# Patient Record
Sex: Female | Born: 1950 | Race: Black or African American | Hispanic: No | Marital: Married | Smoking: Former smoker
Health system: Southern US, Community
[De-identification: ages and names within clinical notes are randomized; demographics above are authoritative.]

## PROBLEM LIST (undated history)

## (undated) DIAGNOSIS — J449 Chronic obstructive pulmonary disease, unspecified: Secondary | ICD-10-CM

## (undated) DIAGNOSIS — J45909 Unspecified asthma, uncomplicated: Secondary | ICD-10-CM

## (undated) DIAGNOSIS — I10 Essential (primary) hypertension: Secondary | ICD-10-CM

## (undated) HISTORY — DX: Essential (primary) hypertension: I10

## (undated) HISTORY — DX: Chronic obstructive pulmonary disease, unspecified: J44.9

## (undated) HISTORY — DX: Unspecified asthma, uncomplicated: J45.909

---

## 2009-02-20 ENCOUNTER — Ambulatory Visit: Payer: Self-pay | Admitting: Internal Medicine

## 2009-02-20 DIAGNOSIS — I10 Essential (primary) hypertension: Secondary | ICD-10-CM

## 2009-02-20 DIAGNOSIS — J449 Chronic obstructive pulmonary disease, unspecified: Secondary | ICD-10-CM | POA: Insufficient documentation

## 2009-02-20 DIAGNOSIS — R635 Abnormal weight gain: Secondary | ICD-10-CM | POA: Insufficient documentation

## 2009-03-27 ENCOUNTER — Ambulatory Visit: Payer: Self-pay | Admitting: Internal Medicine

## 2009-03-27 ENCOUNTER — Telehealth: Payer: Self-pay | Admitting: Internal Medicine

## 2009-04-04 ENCOUNTER — Telehealth: Payer: Self-pay | Admitting: Internal Medicine

## 2009-05-01 ENCOUNTER — Encounter: Payer: Self-pay | Admitting: Internal Medicine

## 2009-06-24 ENCOUNTER — Ambulatory Visit: Payer: Self-pay | Admitting: Internal Medicine

## 2010-01-10 ENCOUNTER — Telehealth: Payer: Self-pay | Admitting: Internal Medicine

## 2010-01-27 ENCOUNTER — Telehealth (INDEPENDENT_AMBULATORY_CARE_PROVIDER_SITE_OTHER): Payer: Self-pay | Admitting: *Deleted

## 2010-03-11 NOTE — Assessment & Plan Note (Signed)
Summary: Pulmonary/ final summary f/u ov   Copy to:  Dr. [name] Primary Provider/Referring Provider:  none  CC:  3 month followup.  Pt states that her breathing has been doing well.  She c/o nasal congestion x 1 wk- she relates this to pollen allergy.  No other complaints today.  Marland Kitchen  History of Present Illness: 51  yowb quit smoking 2004 @ wt 130   with breathing problems  and cough then seemed some better quit but still required combivent about twice daily.  February 20, 2009 cc sob x 1.5 years to point where sometimes can't get across a large room other times can do mall waling and house work with no discernable pattern.  No nocturnal or early am cough or congestion but occasionally develops harsh upper airway cough with deep insp rec symbicort 160  March 27, 2009 1 month followup.  Pt states that her breathing is much improved since last seen.  She stateds that she only gets SOB after very strenuous activity and she does recover fast.  She states that symbicort has really helped alot with minimal need for albuterol now.  Jun 24, 2009 3 month followup.  Pt states that her breathing has been doing well.  She c/o nasal congestion x 1 wk- she relates this to pollen allergy.  No other complaints today.  did 3 miles in April for MS had to stops 3 times per mile.  Pt denies any significant sore throat, dysphagia, itching, sneezing,  nasal congestion or excess secretions,  fever, chills, sweats, unintended wt loss, pleuritic or exertional cp, hempoptysis, change in activity tolerance  orthopnea pnd or leg swelling.  Pt also denies any obvious fluctuation in symptoms with weather or environmental change or other alleviating or aggravating factors.       Current Medications (verified): 1)  Hyzaar 50-12.5 Mg Tabs (Losartan Potassium-Hctz) .Marland Kitchen.. 1 Once Daily 2)  Aspirin 81 Mg Tbec (Aspirin) .Marland Kitchen.. 1 Once Daily 3)  Multivitamins  Tabs (Multiple Vitamin) .Marland Kitchen.. 1 Once Daily 4)  Ventolin Hfa 108 (90  Base) Mcg/act Aers (Albuterol Sulfate) .... 2 Puffs Every 4-6 Hours As Needed 5)  Symbicort 160-4.5 Mcg/act  Aero (Budesonide-Formoterol Fumarate) .... 2 Puffs First Thing  in Am and 2 Puffs Again in Pm About 12 Hours Later  Allergies (verified): No Known Drug Allergies  Past History:  Past Medical History: HYPERTENSION (ICD-401.9) COPD (ICD-496)    - PFT's February 20, 2009  FEV .62 (32%) ratio 38    - HFA  February 20, 2009 50%> 75% March 27, 2009 > 90% Jun 24, 2009     - PFT's March 27, 2009 FEV1 .85 (40%)  44% and no better adfter B2 and DLC0 39% ? childhood asthma     Vital Signs:  Patient profile:   60 year old female Weight:      161 pounds O2 Sat:      94 % on Room air Temp:     98.5 degrees F oral Pulse rate:   106 / minute BP sitting:   160 / 86  (left arm)  Vitals Entered By: Vernie Murders (Jun 24, 2009 11:53 AM)  O2 Flow:  Room air  Physical Exam  Additional Exam:  very pleasant amb bf nad wt 160  March 27, 2009 > 161 Jun 24, 2009  HEENT mild turbinate edema.  Oropharynx no thrush or excess pnd or cobblestoning.  No JVD or cervical adenopathy. Mild accessory muscle hypertrophy. Trachea midline, nl thryroid. Chest  was hyperinflated by percussion with diminished breath sounds and moderate increased exp time without wheeze. Hoover sign positive at mid inspiration. Regular rate and rhythm without murmur gallop or rub or increase P2 or edema.  Abd: no hsm, nl excursion. Ext warm without cyanosis or clubbing.     Impression & Recommendations:  Problem # 1:  COPD (ICD-496) She has GOLD III mod / severe copd with clear evidence clinically of response to B2 and responded nicely to symbicort despite suboptimal hfa technique in the past.  I had an extended summary discussion with the patient today lasting 15 to 20 minutes of a 25 minute visit on the following issues:  . I spent extra time with the patient today explaining optimal mdi  technique.  This improved from   75 -90%  Ok  off spiriva> no  affects ex tolerance, though option to add it back it back if limited by doe in terms of desired activities.  Comment:   I think of spiriva in this setting like purchasing high octane fuel for an older car with lots of miles on it. It may help the perfomance enough to warrant the purchase, but it won't change the longevity of the car or make it any easier parking it. It should improve peak performance if the patient is patient and lets the medicine work the way it's intended  - improving activity tolerance where limits on the mechanical ventilatory ceiling causing dynamic hyperinflation is the problem.   Problem # 2:  HYPERTENSION (ICD-401.9)  Her updated medication list for this problem includes:    Hyzaar 50-12.5 Mg Tabs (Losartan potassium-hctz) .Marland Kitchen... 1 once daily   Adjust rx per her primary physician.  Other Orders: Est. Patient Level IV (16109)  Patient Instructions: 1)  Stay on Symbicort 160 2 puffs first thing  in am and 2 puffs again in pm about 12 hours later and return here if needed

## 2010-03-11 NOTE — Consult Note (Signed)
Summary: [facility]   Imported By: Lester [place] 05/08/2009 10:32:03  _____________________________________________________________________  External Attachment:    Type:   Image     Comment:   External Document

## 2010-03-11 NOTE — Progress Notes (Signed)
Summary: No need for dailiresp here   Phone Note Call from Patient Call back at [phone]   Caller: Daughter-amelia Hoard Call For: Rajat Staver Reason for Call: Talk to Nurse Summary of Call: Requesting to speak to Shepherd about mother.  Has a question about a new drug that drug reps have been talking to her about and wants to know if Dr. Sherene Sires knows anything about med Initial call taken by: Lehman Prom,  January 10, 2010 9:52 AM  Follow-up for Phone Call        [id number]. Carron Curie CMA  January 10, 2010 11:13 AM Spoke with pt daughter Dr. Okey Dupre and she states that drug reps that come into her office keep pushing her to put her mother on dailresp. She is not familiar with the medication and wants to know what MW opiion is. Does her mom need this medication, is she a candidate for it and does MW think it is appropriate for her. If not that is fine with them she just wanted to [facility] his opinion. Please advsie.Carron Curie CMA  January 10, 2010 12:11 PM very limited indication for pts with frequent acute exac despite a reasonable maintenance regimen - in this case pt's doing fine on symbicort so no need for dailiresp Follow-up by: Nyoka Cowden MD,  January 10, 2010 12:45 PM  Additional Follow-up for Phone Call Additional follow up Details #1::        advised pt daughter of response. she also states that pt had PNA vaccine at PCP. Vaccine documented Carron Curie CMA  January 10, 2010 12:50 PM       Immunization History:  Pneumovax Immunization History:    Pneumovax:  historical (01/10/2010)

## 2010-03-11 NOTE — Assessment & Plan Note (Signed)
Summary: Pulmonary/f/u ov with pft's and hfa teaching   Copy to:  Dr. [name] Primary Provider/Referring Provider:  none  CC:  1 month followup.  Pt states that her breathing is much improved since last seen.  She stateds that she only gets SOB after very strenuous activity and she does recover fast.  She states that symbicort has really helped alot and denies any complaints..  History of Present Illness: 24  yowb quit smoking 2004 @ wt 130   with breathing problems  and cough then seemed some better quit but still required combivent about twice daily.  February 20, 2009 cc sob x 1.5 years to point where sometimes can't get across a large room other times can do mall waling and house work with no discernable pattern.  No nocturnal or early am cough or congestion but occasionally develops harsh upper airway cough with deep insp rec symbicort 160  March 27, 2009 1 month followup.  Pt states that her breathing is much improved since last seen.  She stateds that she only gets SOB after very strenuous activity and she does recover fast.  She states that symbicort has really helped alot with minimal need for albuterol now. Pt denies any significant sore throat, dysphagia, itching, sneezing,  nasal congestion or excess secretions,  fever, chills, sweats, unintended wt loss, pleuritic or exertional cp, hempoptysis, change in activity tolerance  orthopnea pnd or leg swelling. Pt also denies any obvious fluctuation in symptoms with weather or environmental change or other alleviating or aggravating factors.          Current Medications (verified): 1)  Spiriva Handihaler 18 Mcg Caps (Tiotropium Bromide Monohydrate) .... Inhale Contents of 1 Cap Daily 2)  Hyzaar 50-12.5 Mg Tabs (Losartan Potassium-Hctz) .Marland Kitchen.. 1 Once Daily 3)  Aspirin 81 Mg Tbec (Aspirin) .Marland Kitchen.. 1 Once Daily 4)  Multivitamins  Tabs (Multiple Vitamin) .Marland Kitchen.. 1 Once Daily 5)  Ventolin Hfa 108 (90 Base) Mcg/act Aers (Albuterol Sulfate)  .... 2 Puffs Every 4-6 Hours As Needed 6)  Symbicort 160-4.5 Mcg/act  Aero (Budesonide-Formoterol Fumarate) .... 2 Puffs First Thing  in Am and 2 Puffs Again in Pm About 12 Hours Later  Allergies (verified): No Known Drug Allergies  Past History:  Past Medical History: HYPERTENSION (ICD-401.9) COPD (ICD-496)    - PFT's February 20, 2009  FEV .62 (32%) ratio 38    - HFA  February 20, 2009 50%> 75% March 27, 2009     - PFT's March 27, 2009 FEV1 .85 (40%)  44% and no better adfter B2 and DLC0 39% ? childhood asthma     Vital Signs:  Patient profile:   60 year old female Weight:      160 pounds O2 Sat:      94 % on Room air Temp:     98.4 degrees F oral Pulse rate:   98 / minute BP sitting:   130 / 70  (left arm)  Vitals Entered By: Vernie Murders (March 27, 2009 10:59 AM)  O2 Flow:  Room air  Physical Exam  Additional Exam:  very pleasant amb bf nad wt 159 > 160  March 27, 2009  HEENT mild turbinate edema.  Oropharynx no thrush or excess pnd or cobblestoning.  No JVD or cervical adenopathy. Mild accessory muscle hypertrophy. Trachea midline, nl thryroid. Chest was hyperinflated by percussion with diminished breath sounds and moderate increased exp time without wheeze. Hoover sign positive at mid inspiration. Regular rate and rhythm without murmur gallop  or rub or increase P2 or edema.  Abd: no hsm, nl excursion. Ext warm without cyanosis or clubbing.     Impression & Recommendations:  Problem # 1:  COPD (ICD-496) I had an extended discussion with the patient today lasting 15 to 20 minutes of a 25 minute visit on the following issues:  She has GOLD III mod / severe copd with clear evidence clinically of response to B2 and responded nicely to symbicort despite suboptimal hfa technique. . I spent extra time with the patient today explaining optimal mdi  technique.  This improved from  50-75%  Ok to try off spiriva to see if affects ex tolerance.  Comment:   I think  of spiriva in this setting like purchasing high octane fuel for an older car with lots of miles on it. It may help the perfomance enough to warrant the purchase, but it won't change the longevity of the car or make it any easier parking it. It should improve peak performance if the patient is patient and lets the medicine work the way it's intended  - improving activity tolerance where limits on the mechanical ventilatory ceiling causing dynamic hyperinflation is the problem.   Other Orders: Est. Patient Level IV (96295)  Patient Instructions: 1)  Symbicort 2 puffs first thing  in am and 2 puffs again in pm about 12 hours later 2)  Work on inhaler technique:  relax and blow all the way out then take a nice smooth deep breath back in, triggering the inhaler at same time you start breathing in 3)  GERD (REFLUX)  is a common cause of respiratory symptoms. It commonly presents without heartburn and can be treated with medication, but also with lifestyle changes including avoidance of late meals, excessive alcohol, smoking cessation, and avoid fatty foods, chocolate, peppermint, colas, red wine, and acidic juices such as orange juice. NO MINT OR MENTHOL PRODUCTS SO NO COUGH DROPS  4)  USE SUGARLESS CANDY INSTEAD (jolley ranchers)  5)  NO OIL BASED VITAMINS  6)  Return to office in 3 months, sooner if needed  7)  ok to try off spiriva but if breathing worsens over several weeks need to resum it.

## 2010-03-11 NOTE — Assessment & Plan Note (Signed)
Summary: Pulmonary/ initial eval, start symbicort 160   Visit Type:  Initial Consult Copy to:  Dr. [name] Primary Provider/Referring Provider:  none  CC:  COPD.  History of Present Illness: 60 yowb quit smoking 2004 @ wt 130   with breathing problems  and cough then seemed some better quit but still required combivent about twice daily.  February 20, 2009 cc sob x 1.5 years to point where sometimes can't get across a large room other times can do mall waling and house work with no discernable pattern.  No nocturnal or early am cough or congestion but occasionally develops harsh upper airway cough with deep insp  Pt denies any significant sore throat, dysphagia, itching, sneezing,  nasal congestion or excess secretions,  fever, chills, sweats, unintended wt loss, pleuritic or exertional cp, hempoptysis, change in activity tolerance  orthopnea pnd or leg swelling Pt also denies any obvious fluctuation in symptoms with weather or environmental change or other alleviating or aggravating factors.       Current Medications (verified): 1)  Spiriva Handihaler 18 Mcg Caps (Tiotropium Bromide Monohydrate) .... Inhale Contents of 1 Cap Daily 2)  Hyzaar 50-12.5 Mg Tabs (Losartan Potassium-Hctz) .Marland Kitchen.. 1 Once Daily 3)  Aspirin 81 Mg Tbec (Aspirin) .Marland Kitchen.. 1 Once Daily 4)  Multivitamins  Tabs (Multiple Vitamin) .Marland Kitchen.. 1 Once Daily 5)  Ventolin Hfa 108 (90 Base) Mcg/act Aers (Albuterol Sulfate) .... 2 Puffs Every 4-6 Hours As Needed  Allergies (verified): No Known Drug Allergies  Past History:  Past Medical History: HYPERTENSION (ICD-401.9) COPD (ICD-496)    - PFT's February 20, 2009  FEV .62 (32%) ratio 38    - HFA  February 20, 2009 50% ? childhood asthma     Family History: Heart dz- Father 18s Head and Neck CA- Mother HBP/dm in siblings pt is second to oldest  Social History: Single Children Former smoker.  Quit in 2004.  Quit for approx 30 yrs up to 1 ppd. Occ marijuana use Rare  ETOH Disabled  Review of Systems       The patient complains of shortness of breath with activity, productive cough, weight change, tooth/dental problems, and joint stiffness or pain.  The patient denies shortness of breath at rest, non-productive cough, coughing up blood, chest pain, irregular heartbeats, acid heartburn, indigestion, loss of appetite, abdominal pain, difficulty swallowing, sore throat, headaches, nasal congestion/difficulty breathing through nose, sneezing, itching, ear ache, anxiety, depression, hand/feet swelling, rash, change in color of mucus, and fever.    Vital Signs:  Patient profile:   60 year old female Height:      62 inches Weight:      159.50 pounds BMI:     29.28 O2 Sat:      96 % on Room air Temp:     97.5 degrees F oral Pulse rate:   100 / minute BP sitting:   122 / 74  (left arm)  Vitals Entered By: Vernie Murders (February 20, 2009 2:29 PM)  O2 Flow:  Room air  O2 Sat Comments when pt arrived o2 sat was 86%ra  Physical Exam  Additional Exam:  very pleasant amb bf nad HEENT mild turbinate edema.  Oropharynx no thrush or excess pnd or cobblestoning.  No JVD or cervical adenopathy. Mild accessory muscle hypertrophy. Trachea midline, nl thryroid. Chest was hyperinflated by percussion with diminished breath sounds and moderate increased exp time without wheeze. Hoover sign positive at mid inspiration. Regular rate and rhythm without murmur gallop or rub or  increase P2 or edema.  Abd: no hsm, nl excursion. Ext warm without cyanosis or clubbing.     CXR  Procedure date:  02/20/2009  Findings:        Findings: Heart size is normal.  The mediastinum is unremarkable. The lungs are clear.  A linear scar in the right midlung.  There may be emphysema in the upper lobes.  No infiltrate, mass, effusion or collapse.  No significant bony abnormality.   IMPRESSION: Linear scar right midlung.   Hyperlucency in the upper lobes that could reflect  emphysema.  Pulmonary Function Test Date: 02/20/2009 3:20 PM Gender: Female  Pre-Spirometry FVC    Value: 1.64 L/min   % Pred: 66.30 % FEV1    Value: 0.63 L     Pred: 1.95 L     % Pred: 32.20 % FEV1/FVC  Value: 38.25 %     % Pred: 47.90 %  Impression & Recommendations:  Problem # 1:  COPD (ICD-496) GOLD III severe copd by baseline pft's not responding well to spiriva monotherapy.  Hopefully she has a significant asthmatic component suggested by the variability of her symptoms and increasing need for saba  I spent extra time with the patient today explaining optimal mdi  technique.  This improved from  25-50%  Would like to try symbiocort 160 2 puffs first thing  in am and 2 puffs again in pm about 12 hours later x 4 weeks then return for PFT's  Problem # 2:  HYPERTENSION (ICD-401.9)  May need to consider alternative rx based on anecdotal reports of ace -like side effects of losartan.  for now no change rec  Her updated medication list for this problem includes:    Hyzaar 50-12.5 Mg Tabs (Losartan potassium-hctz) .Marland Kitchen... 1 once daily  Orders: New Patient Level V (16109)  Problem # 3:  WEIGHT GAIN, ABNORMAL (ICD-783.1) She has gain significant wt since quit smoking and might benefit from a formal pulmonary rehab program locally to maintain conditioning and prevent further wt gain.  Medications Added to Medication List This Visit: 1)  Spiriva Handihaler 18 Mcg Caps (Tiotropium bromide monohydrate) .... Inhale contents of 1 cap daily 2)  Hyzaar 50-12.5 Mg Tabs (Losartan potassium-hctz) .Marland Kitchen.. 1 once daily 3)  Aspirin 81 Mg Tbec (Aspirin) .Marland Kitchen.. 1 once daily 4)  Multivitamins Tabs (Multiple vitamin) .Marland Kitchen.. 1 once daily 5)  Ventolin Hfa 108 (90 Base) Mcg/act Aers (Albuterol sulfate) .... 2 puffs every 4-6 hours as needed 6)  Symbicort 160-4.5 Mcg/act Aero (Budesonide-formoterol fumarate) .... 2 puffs first thing  in am and 2 puffs again in pm about 12 hours later  Other Orders: T-2 View  CXR (71020TC)  Patient Instructions: 1)  Symbicort 2 puffs first thing  in am and 2 puffs again in pm about 12 hours later 2)  Work on inhaler technique:  relax and blow all the way out then take a nice smooth deep breath back in, triggering the inhaler at same time you start breathing in 3)  GERD (REFLUX)  is a common cause of respiratory symptoms. It commonly presents without heartburn and can be treated with medication, but also with lifestyle changes including avoidance of late meals, excessive alcohol, smoking cessation, and avoid fatty foods, chocolate, peppermint, colas, red wine, and acidic juices such as orange juice. NO MINT OR MENTHOL PRODUCTS SO NO COUGH DROPS  4)  USE SUGARLESS CANDY INSTEAD (jolley ranchers)  5)  NO OIL BASED VITAMINS  6)  Please schedule a follow-up appointment  in 1 month with PFT's   [facility] Spirometry  ID: [id number] Patient: Ausley, Elianne DOB: 1950/11/24 Age: 60 Years Old Sex: Female Race: Black Height: 62 Weight: 159.50 Status: Unconfirmed Recorded: 02/20/2009 3:20 PM  Parameter  Measured Predicted %Predicted FVC     1.64        2.47        66.30 FEV1     0.63        1.95        32.20 FEV1%   38.25        79.83        47.90 PEF    1.13        5.38        21   Interpretation:

## 2010-03-11 NOTE — Progress Notes (Signed)
Summary: rx  Phone Note Call from Patient Call back at Home Phone [phone]   Caller: Patient Call For: Lauran Romanski Reason for Call: Talk to Nurse Summary of Call: pt needs Ventilin called in to pharmacy  [facility] [address] [place] Initial call taken by: Eugene Gavia,  April 04, 2009 1:51 PM  Follow-up for Phone Call        ventolin has been sent to the pharmacy per pts request.  pt is aware Randell Loop CMA  April 04, 2009 2:22 PM     Prescriptions: VENTOLIN HFA 108 (90 BASE) MCG/ACT AERS (ALBUTEROL SULFATE) 2 puffs every 4-6 hours as needed  #1 x 6   Entered by:   Randell Loop CMA   Authorized by:   Nyoka Cowden MD   Signed by:   Randell Loop CMA on 04/04/2009   Method used:   [facility]        [facility] [phone]* (retail)       [address]       [place], [place]  [address]       Ph: [phone] or [phone]       Fax: [phone]   RxID:   [id number]

## 2010-03-11 NOTE — Miscellaneous (Signed)
Summary: Orders Update pft charges  Clinical Lists Changes  Orders: Added new Service order of Carbon Monoxide diffusing w/capacity (94720) - Signed Added new Service order of Lung Volumes (94240) - Signed Added new Service order of Spirometry (Pre & Post) (94060) - Signed 

## 2010-03-13 NOTE — Progress Notes (Signed)
Summary: rx  Phone Note Call from Patient Call back at Home Phone [phone]   Caller: Patient Call For: wert Reason for Call: Talk to Nurse Summary of Call: Patient asking for rx--symbicort--[facility] Initial call taken by: Lehman Prom,  January 27, 2010 9:07 AM  Follow-up for Phone Call        [facility] refill for Symbicort was sent to pharmacy. Abigail Miyamoto RN  January 27, 2010 10:13 AM     Prescriptions: SYMBICORT 160-4.5 MCG/ACT  AERO (BUDESONIDE-FORMOTEROL FUMARATE) 2 puffs first thing  in am and 2 puffs again in pm about 12 hours later  #1 x 6   Entered by:   Abigail Miyamoto RN   Authorized by:   Nyoka Cowden MD   Signed by:   Abigail Miyamoto RN on 01/27/2010   Method used:   [facility]ronically to        [facility] [phone]* (retail)       [address]       [place], [place]  [address]       Ph: [phone] or [phone]       Fax: [phone]   RxID:   [id number]

## 2010-06-18 ENCOUNTER — Encounter: Payer: Self-pay | Admitting: Internal Medicine

## 2010-06-18 ENCOUNTER — Ambulatory Visit (INDEPENDENT_AMBULATORY_CARE_PROVIDER_SITE_OTHER): Payer: Medicare Other | Admitting: Internal Medicine

## 2010-06-18 DIAGNOSIS — J449 Chronic obstructive pulmonary disease, unspecified: Secondary | ICD-10-CM

## 2010-06-18 DIAGNOSIS — J4489 Other specified chronic obstructive pulmonary disease: Secondary | ICD-10-CM

## 2010-06-18 DIAGNOSIS — I10 Essential (primary) hypertension: Secondary | ICD-10-CM

## 2010-06-18 MED ORDER — VALSARTAN-HYDROCHLOROTHIAZIDE 160-12.5 MG PO TABS: 1.0000 | ORAL_TABLET | Freq: Every day | ORAL | Status: DC

## 2010-06-18 NOTE — Patient Instructions (Signed)
Work on inhaler technique:  relax and gently blow all the way out then take a nice smooth deep breath back in, triggering the inhaler at same time you start breathing in.  Hold for up to 5 seconds if you can.  Rinse and gargle with water when done   If your mouth or throat starts to bother you,   I suggest you time the inhaler to your dental care and after using the inhaler(s) brush teeth and tongue with a baking soda containing toothpaste and when you rinse this out, gargle with it first to see if this helps your mouth and throat.    Stop lisinopril and try diovan 160/12.5 mg in its place for a 6 week trial you may notice less throat irritation, less heartburn, better breathing and less need for rescue inhalters.

## 2010-06-18 NOTE — Progress Notes (Signed)
  Subjective:    Patient ID: [name], female    DOB: April 22, 1950, 60 y.o.   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  February 20, 2009 cc sob x 1.5 years to point where sometimes can't get across a large room other times can do mall waling and house work with no discernable pattern. No nocturnal or early am cough or congestion but occasionally develops harsh upper airway cough with deep insp rec symbicort 160   March 27, 2009 1 month followup. Pt states that her breathing is much improved since last seen. She stateds that she only gets SOB after very strenuous activity and she does recover fast. She states that symbicort has really helped alot with minimal need for albuterol now. rec no change      06/18/2010 ov / Wert cc doe x heavy exertion only. No cough.  Sleeping ok without nocturnal  or early am exac of resp c/o's or need for noct saba.  varible heartburn, throat congestion on ACE x years  Pt denies any significant sore throat, dysphagia, itching, sneezing,  nasal congestion or excess/ purulent secretions,  fever, chills, sweats, unintended wt loss, pleuritic or exertional cp, hempoptysis, orthopnea pnd or leg swelling.    Also denies any obvious fluctuation of symptoms with weather or environmental changes or other aggravating or alleviating factors.       Past Medical History:  HYPERTENSION (ICD-401.9)  COPD (ICD-496)  - PFT's February 20, 2009 FEV .62 (32%) ratio 38  - HFA February 20, 2009 50%> 75% March 27, 2009 >  75% 06/18/2010  - PFT's March 27, 2009 FEV1 .85 (40%) 44% and no better adfter B2 and DLC0 39%  ? childhood asthma        Review of Systems     Objective:   Physical Exam  very pleasant amb bf nad  wt 160 March 27, 2009 > 161 Jun 24, 2009 > 139 06/18/2010  HEENT mild turbinate edema. Oropharynx no  thrush or excess pnd or cobblestoning. No JVD or cervical adenopathy. Mild accessory muscle hypertrophy. Trachea midline, nl thryroid. Chest was hyperinflated by percussion with diminished breath sounds and moderate increased exp time without wheeze. Hoover sign positive at mid inspiration. Regular rate and rhythm without murmur gallop or rub or increase P2 or edema. Abd: no hsm, nl excursion. Ext warm without cyanosis or clubbing.        Assessment & Plan:

## 2010-06-18 NOTE — Assessment & Plan Note (Signed)
ACE inhibitors are problematic in  pts with airway complaints because  even experienced pulmonologists can't always distinguish ace effects from copd/asthma.  By themselves they don't actually cause a problem, much like oxygen can't by itself start a fire, but they certainly serve as a powerful catalyst or enhancer for any "fire"  or inflammatory process in the upper airway, be it caused by an ET  tube or more commonly reflux (especially in the obese or pts with known GERD as in this case or who are on biphoshonates).    In the era of ARB near equivalency until we have a better handle on the reversibility of the airway problem, it just makes sense to avoid ACEI  entirely in the short run and then decide later, having established a level of airway control using a reasonable limited regimen, whether to add back ace but even then being very careful to observe the pt for worsening airway control and number of meds used/ needed to control symptoms.   Rec trial of diovan 16012.5 x 6 week samples to see what  If any difference this makes in her symptoms control

## 2010-06-18 NOTE — Assessment & Plan Note (Signed)
Focus on symptom control effective  The proper method of use, as well as anticipated side effects, of this metered-dose inhaler are discussed and demonstrated to the patient. Improved to 75% with coaching.    Each maintenance medication was reviewed in detail including most importantly the difference between maintenance and as needed and under what circumstances the prns are to be used.  Please see instructions for details which were reviewed in writing and the patient given a copy.  See the written copy of this report in the patient's paper medical record.  These results did not interface directly into the electronic medical record and are summarized here.

## 2010-06-30 ENCOUNTER — Telehealth: Payer: Self-pay | Admitting: Internal Medicine

## 2010-06-30 MED ORDER — BUDESONIDE-FORMOTEROL FUMARATE 160-4.5 MCG/ACT IN AERO: 2.0000 | INHALATION_SPRAY | Freq: Two times a day (BID) | RESPIRATORY_TRACT | Status: DC

## 2010-06-30 MED ORDER — ALBUTEROL SULFATE HFA 108 (90 BASE) MCG/ACT IN AERS: 2.0000 | INHALATION_SPRAY | Freq: Four times a day (QID) | RESPIRATORY_TRACT | Status: DC | PRN

## 2010-06-30 NOTE — Telephone Encounter (Signed)
Refill sent. Pt aware.[name], CMA  

## 2010-07-01 ENCOUNTER — Telehealth: Payer: Self-pay | Admitting: Internal Medicine

## 2010-07-01 MED ORDER — VENTOLIN HFA 108 (90 BASE) MCG/ACT IN AERS: 2.0000 | INHALATION_SPRAY | Freq: Four times a day (QID) | RESPIRATORY_TRACT | Status: DC | PRN

## 2010-07-01 NOTE — Telephone Encounter (Signed)
Pt states she is tired of dealing with [facility] and wants her ventolin rx sent to [facility] instead. rx sent. Pt aware.Carron Curie, CMA

## 2010-07-28 ENCOUNTER — Telehealth: Payer: Self-pay | Admitting: Internal Medicine

## 2010-07-28 MED ORDER — VALSARTAN-HYDROCHLOROTHIAZIDE 160-12.5 MG PO TABS: 1.0000 | ORAL_TABLET | Freq: Every day | ORAL | Status: DC

## 2010-07-28 NOTE — Telephone Encounter (Signed)
Pt states since using Diovan breathing is "great". Blood pressure readings range BP:138/84-127/74, breathing has improved 9 of 10 since Diovan vs previous  3 or 4 of 10. Still c/o "acid taste" after eating certain foods. Has not had to use rescue inhaler since starting Diovan.

## 2010-07-28 NOTE — Telephone Encounter (Signed)
Great news but will need f/u rx for diovan longterm - prefer that be done by whomever her primary is but we can certainly call in a rx that will last until her next ov with them

## 2010-07-28 NOTE — Telephone Encounter (Signed)
Spoke with pt and notified of recs per MW.  Pt states that she has rov with her PCP on 08/15/10 and will have them refill med. I sent her a 1 month supply of diovan to her pharmacy to last until then.

## 2010-07-28 NOTE — Telephone Encounter (Signed)
LMOMTCB x 1. Need to follow up with pt on how her symptoms are with the Diovan.

## 2010-08-24 ENCOUNTER — Other Ambulatory Visit: Payer: Self-pay | Admitting: Internal Medicine

## 2010-12-29 ENCOUNTER — Other Ambulatory Visit: Payer: Self-pay | Admitting: Internal Medicine

## 2011-07-31 ENCOUNTER — Ambulatory Visit: Payer: Medicare Other | Admitting: Internal Medicine

## 2011-09-01 ENCOUNTER — Ambulatory Visit: Payer: Medicare Other | Admitting: Internal Medicine

## 2011-09-14 ENCOUNTER — Ambulatory Visit (INDEPENDENT_AMBULATORY_CARE_PROVIDER_SITE_OTHER): Payer: Medicare Other | Admitting: Internal Medicine

## 2011-09-14 ENCOUNTER — Encounter: Payer: Self-pay | Admitting: Internal Medicine

## 2011-09-14 ENCOUNTER — Ambulatory Visit (INDEPENDENT_AMBULATORY_CARE_PROVIDER_SITE_OTHER)
Admission: RE | Admit: 2011-09-14 | Discharge: 2011-09-14 | Disposition: A | Discharge status: Home / Self Care | Payer: Medicare Other | Source: Ambulatory Visit | Attending: Internal Medicine | Admitting: Internal Medicine

## 2011-09-14 VITALS — BP 112/60 | HR 79 | Temp 97.7°F | Ht 62.0 in | Wt 146.0 lb

## 2011-09-14 DIAGNOSIS — J449 Chronic obstructive pulmonary disease, unspecified: Secondary | ICD-10-CM

## 2011-09-14 DIAGNOSIS — J4489 Other specified chronic obstructive pulmonary disease: Secondary | ICD-10-CM

## 2011-09-14 DIAGNOSIS — I10 Essential (primary) hypertension: Secondary | ICD-10-CM

## 2011-09-14 MED ORDER — BUDESONIDE-FORMOTEROL FUMARATE 160-4.5 MCG/ACT IN AERO: 2.0000 | INHALATION_SPRAY | Freq: Two times a day (BID) | RESPIRATORY_TRACT | Status: DC

## 2011-09-14 NOTE — Patient Instructions (Addendum)
Work on inhaler technique:  relax and gently blow all the way out then take a nice smooth deep breath back in, triggering the inhaler at same time you start breathing in.  Hold for up to 5 seconds if you can.  Rinse and gargle with water when done   If your mouth or throat starts to bother you,   I suggest you time the inhaler to your dental care and after using the inhaler(s) brush teeth and tongue with a baking soda containing toothpaste and when you rinse this out, gargle with it first to see if this helps your mouth and throat.     Stop fish oil and just eat two servings of salmon a week  Please schedule a follow up visit in 12 months but call sooner if needed

## 2011-09-14 NOTE — Assessment & Plan Note (Signed)
-   PFT's February 20, 2009 FEV .62 (32%) ratio 38  So GOLD III - HFA 75% - PFT's March 27, 2009 FEV1 .85 (40%) 44% and no better adfter B2 and DLC0 39%  - Alpha one Antitrypsin 09/14/2011 >>  GOLD III but relatively well compensated s tendency to aecopd on present rx, reviewed in detail - option to add LAMA but really not needed at this point.  The proper method of use, as well as anticipated side effects, of a metered-dose inhaler are discussed and demonstrated to the patient. Improved effectiveness after extensive coaching during this visit to a level of approximately  75%

## 2011-09-14 NOTE — Progress Notes (Signed)
Quick Note:  Spoke with pt and notified of results per Dr. Wert. Pt verbalized understanding and denied any questions.  ______ 

## 2011-09-14 NOTE — Assessment & Plan Note (Signed)
?   ACE cough 06/18/2010  >   changed to ARB > resolved  Adequate control on present rx, reviewed need to avoid ace here.

## 2011-09-14 NOTE — Progress Notes (Signed)
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  February 20, 2009 cc sob x 1.5 years to point where sometimes can't get across a large room other times can do mall walking and house work with no discernable pattern. No nocturnal or early am cough or congestion but occasionally develops harsh upper airway cough with deep insp  rec symbicort 160   March 27, 2009 1 month followup. Pt states that her breathing is much improved since last seen. She states that she only gets SOB after very strenuous activity and she does recover fast. She states that symbicort has really helped alot with minimal need for albuterol now.  rec no change  06/18/2010 ov / Yousif Edelson cc doe x heavy exertion only. No cough.  Sleeping ok without nocturnal  or early am exac of resp c/o's or need for noct saba.  varible heartburn, throat congestion on ACE x years. Rec Work on inhaler technique:  Stop lisinopril and try diovan 160/12.5 mg in its place for a 6 week trial you may notice less throat irritation, less heartburn, better breathing and less need for rescue inhalters.  09/14/2011 f/u ov/Irisa Grimsley sore throat resolved on arb vs ace, and prilosec p lunch and symbicort 160 2bid  with variable doe when go out in heat.  Basically satisfied with doe as long as paces and avoids the head, cough intermittent and minimally productive, mucoid sputum. No obvious daytime indoor variabilty or assoc   cp or chest tightness, subjective wheeze overt sinus or hb symptoms. No unusual exp hx. No need for daytime saba.  Sleeping ok without nocturnal  or early am exacerbation  of respiratory  c/o's or need for noct saba. Also denies any obvious fluctuation of symptoms with weather or environmental changes or other aggravating or alleviating  factors except as outlined above   ROS  The following are not active complaints unless bolded sore throat, dysphagia, dental problems, itching, sneezing,  nasal congestion or excess/ purulent secretions, ear ache,   fever, chills, sweats, unintended wt loss, pleuritic or exertional cp, hemoptysis,  orthopnea pnd or leg swelling, presyncope, palpitations, heartburn, abdominal pain, anorexia, nausea, vomiting, diarrhea  or change in bowel or urinary habits, change in stools or urine, dysuria,hematuria,  rash, arthralgias, visual complaints, headache, numbness weakness or ataxia or problems with walking or coordination,  change in mood/affect or memory.        Past Medical History:  HYPERTENSION (ICD-401.9)  COPD (ICD-496)  - PFT's February 20, 2009 FEV .62 (32%) ratio 38  - HFA 75% 09/14/2011  - PFT's March 27, 2009 FEV1 .85 (40%) 44% and no better adfter B2 and DLC0 39%  ? childhood asthma            Objective:   Physical Exam  very pleasant amb bf nad  wt 160 March 27, 2009 > 161 Jun 24, 2009 > 139 06/18/2010 > 09/14/2011  146 HEENT mild turbinate edema. Oropharynx no thrush or excess pnd or cobblestoning. No JVD or cervical adenopathy. Mild accessory muscle hypertrophy. Trachea midline, nl thryroid. Chest was hyperinflated by percussion with diminished breath sounds and moderate increased exp time without wheeze. Hoover sign positive at mid inspiration. Regular rate and rhythm without murmur gallop or rub or increase P2  or edema. Abd: no hsm, nl excursion. Ext warm without cyanosis or clubbing.     CXR  09/14/2011 :  Hyperinflation without acute or superimposed abnormality.     Assessment & Plan:

## 2011-11-13 ENCOUNTER — Telehealth: Payer: Self-pay | Admitting: Internal Medicine

## 2011-11-13 ENCOUNTER — Encounter: Payer: Self-pay | Admitting: Internal Medicine

## 2011-11-13 NOTE — Telephone Encounter (Signed)
Per MW- Alpha 1 test was neg LMTCB so can inform the pt

## 2011-11-16 NOTE — Telephone Encounter (Signed)
Pt returned call. [name] °

## 2011-11-16 NOTE — Telephone Encounter (Signed)
LMTCB

## 2011-11-16 NOTE — Telephone Encounter (Signed)
Spoke with pt and notified of results per Dr. Wert. Pt verbalized understanding and denied any questions. 

## 2011-11-19 ENCOUNTER — Encounter: Payer: Self-pay | Admitting: Internal Medicine

## 2012-09-14 ENCOUNTER — Ambulatory Visit: Payer: Medicare Other | Admitting: Internal Medicine

## 2012-09-20 ENCOUNTER — Ambulatory Visit (INDEPENDENT_AMBULATORY_CARE_PROVIDER_SITE_OTHER): Payer: Medicare Other | Admitting: Internal Medicine

## 2012-09-20 ENCOUNTER — Encounter: Payer: Self-pay | Admitting: Internal Medicine

## 2012-09-20 VITALS — BP 132/88 | HR 87 | Temp 99.8°F | Ht 62.0 in | Wt 142.0 lb

## 2012-09-20 DIAGNOSIS — J449 Chronic obstructive pulmonary disease, unspecified: Secondary | ICD-10-CM

## 2012-09-20 DIAGNOSIS — I10 Essential (primary) hypertension: Secondary | ICD-10-CM

## 2012-09-20 MED ORDER — BUDESONIDE-FORMOTEROL FUMARATE 160-4.5 MCG/ACT IN AERO: INHALATION_SPRAY | RESPIRATORY_TRACT | Status: DC

## 2012-09-20 MED ORDER — ALBUTEROL SULFATE HFA 108 (90 BASE) MCG/ACT IN AERS: 2.0000 | INHALATION_SPRAY | Freq: Four times a day (QID) | RESPIRATORY_TRACT | Status: DC | PRN

## 2012-09-20 NOTE — Assessment & Plan Note (Addendum)
-   PFT's February 20, 2009 FEV .62 (32%) ratio 38    GOLD III   PFT's March 27, 2009 FEV1 .85 (40%) 44% and no better after B2 and DLC0 39%  - Alpha one Antitrypsin 09/14/2011 >>  MM  The proper method of use, as well as anticipated side effects, of a metered-dose inhaler are discussed and demonstrated to the patient. Improved effectiveness after extensive coaching during this visit to a level of approximately  75%   Not limited from desired activities, min need for saba, no cough or tendencey to aecopd > so adequate control on present rx, reviewed > no change in rx needed      Each maintenance medication was reviewed in detail including most importantly the difference between maintenance and as needed and under what circumstances the prns are to be used.  Please see instructions for details which were reviewed in writing and the patient given a copy.

## 2012-09-20 NOTE — Patient Instructions (Addendum)
Continue symbicort 160 Take 2 puffs first thing in am and then another 2 puffs about 12 hours later.    Only use your albuterol as a rescue medication to be used if you can't catch your breath by resting or doing a relaxed purse lip breathing pattern. The less you use it, the better it will work when you need it.   Return in one year for refills - call sooner if needed

## 2012-09-20 NOTE — Progress Notes (Addendum)
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   HPI February 20, 2009 cc sob x 1.5 years to point where sometimes can't get across a large room other times can do mall walking and house work with no discernable pattern. No nocturnal or early am cough or congestion but occasionally develops harsh upper airway cough with deep insp  rec symbicort 160      06/18/2010 ov / Alayha Babineaux cc doe x heavy exertion only. No cough.  Sleeping ok without nocturnal  or early am exac of resp c/o's or need for noct saba.  varible heartburn, throat congestion on ACE x years. Rec Work on inhaler technique:  Stop lisinopril and try diovan 160/12.5 mg in its place for a 6 week trial you may notice less throat irritation, less heartburn, better breathing and less need for rescue inhalters.    09/20/2012 f/u ov/Kierre Hintz  Chief Complaint  Patient presents with  . Follow-up    pt reports overall breathing is doing well-- having some SOB this morning since walkin into building-- denies any other concerns at this itme    Rare need for saba ventolin while on symbicort 160 2bid - only problems walking outside in humid weather or inclines, does fine flat surfaces indoors.  No obvious daytime variabilty or assoc chronic cough or cp or chest tightness, subjective wheeze overt sinus or hb symptoms. No unusual exp hx or h/o childhood pna/ asthma or knowledge of premature birth.   Sleeping ok without nocturnal  or early am exacerbation  of respiratory  c/o's or need for noct saba. Also denies any obvious fluctuation of symptoms with weather or environmental changes or other aggravating or alleviating factors except as outlined above   ROS  The following are not active  complaints unless bolded sore throat, dysphagia, dental problems, itching, sneezing,  nasal congestion or excess/ purulent secretions, ear ache,   fever, chills, sweats, unintended wt loss, pleuritic or exertional cp, hemoptysis,  orthopnea pnd or leg swelling, presyncope, palpitations, heartburn, abdominal pain, anorexia, nausea, vomiting, diarrhea  or change in bowel or urinary habits, change in stools or urine, dysuria,hematuria,  rash, arthralgias, visual complaints, headache, numbness weakness or ataxia or problems with walking or coordination,  change in mood/affect or memory.        Past Medical History:  HYPERTENSION (ICD-401.9)  COPD (ICD-496)  - PFT's February 20, 2009 FEV .62 (32%) ratio 38  - HFA 75% 09/14/2011  - PFT's March 27, 2009 FEV1 .85 (40%) 44% and no better adfter B2 and DLC0 39%  ? childhood asthma            Objective:   Physical Exam    very pleasant amb bf nad   wt 160 March 27, 2009 > 161 Jun 24, 2009 > 139 06/18/2010 > 09/14/2011  146> 09/20/2012  142 HEENT mild turbinate edema. Oropharynx no thrush or excess pnd or cobblestoning. No JVD or cervical adenopathy. Mild accessory muscle hypertrophy. Trachea midline, nl thryroid. Chest was hyperinflated by percussion with diminished breath sounds and moderate increased exp time without wheeze. Hoover sign positive at mid inspiration. Regular rate and rhythm without murmur gallop or rub or increase P2 or edema. Abd: no hsm,  nl excursion. Ext warm without cyanosis or clubbing.      CXR  09/14/2011 :  Hyperinflation without acute or superimposed abnormality.     Assessment & Plan:   Outpatient Encounter Prescriptions as of 09/20/2012  Medication Sig Dispense Refill  . aspirin 81 MG tablet Take 81 mg by mouth daily.        . Calcium Carbonate-Vitamin D (CALTRATE 600+D) 600-400 MG-UNIT per tablet Take 2 tablets by mouth daily.        . Multiple Vitamins-Minerals (CENTRUM SILVER PO) Take 1 capsule by mouth daily.         Marland Kitchen omeprazole (PRILOSEC) 20 MG capsule Take 20 mg by mouth daily.        . rosuvastatin (CRESTOR) 10 MG tablet Take 10 mg by mouth daily.      . [DISCONTINUED] VENTOLIN HFA 108 (90 BASE) MCG/ACT inhaler Inhale 2 puffs into the lungs every 6 (six) hours as needed.  1 Inhaler  4  . albuterol (PROAIR HFA) 108 (90 BASE) MCG/ACT inhaler Inhale 2 puffs into the lungs every 6 (six) hours as needed for wheezing. 2 puffs every 4 hours as needed only  if your can't catch your breath  1 Inhaler  1  . budesonide-formoterol (SYMBICORT) 160-4.5 MCG/ACT inhaler Inhale 2 puffs into the lungs 2 (two) times daily.  10.2 g  11  . budesonide-formoterol (SYMBICORT) 160-4.5 MCG/ACT inhaler Take 2 puffs first thing in am and then another 2 puffs about 12 hours later.  1 Inhaler  12  . valsartan-hydrochlorothiazide (DIOVAN HCT) 160-12.5 MG per tablet Take 1 tablet by mouth daily.  30 tablet  0  . [DISCONTINUED] budesonide-formoterol (SYMBICORT) 160-4.5 MCG/ACT inhaler Inhale 2 puffs into the lungs 2 (two) times daily.  10.2 g  11  . [DISCONTINUED] budesonide-formoterol (SYMBICORT) 160-4.5 MCG/ACT inhaler Take 2 puffs first thing in am and then another 2 puffs about 12 hours later.  1 Inhaler  12   No facility-administered encounter medications on file as of 09/20/2012.

## 2012-09-20 NOTE — Assessment & Plan Note (Signed)
?   ACE cough 06/18/2010  >   changed to ARB > resolved  Adequate control on present rx, reviewed > no change in rx needed

## 2013-02-15 ENCOUNTER — Ambulatory Visit (INDEPENDENT_AMBULATORY_CARE_PROVIDER_SITE_OTHER): Payer: Medicare Other | Admitting: Internal Medicine

## 2013-02-15 ENCOUNTER — Encounter: Payer: Self-pay | Admitting: Internal Medicine

## 2013-02-15 ENCOUNTER — Encounter (INDEPENDENT_AMBULATORY_CARE_PROVIDER_SITE_OTHER): Payer: Self-pay

## 2013-02-15 VITALS — BP 130/82 | HR 97 | Ht 62.0 in | Wt 139.0 lb

## 2013-02-15 DIAGNOSIS — J9610 Chronic respiratory failure, unspecified whether with hypoxia or hypercapnia: Secondary | ICD-10-CM

## 2013-02-15 DIAGNOSIS — J449 Chronic obstructive pulmonary disease, unspecified: Secondary | ICD-10-CM

## 2013-02-15 MED ORDER — ACLIDINIUM BROMIDE 400 MCG/ACT IN AEPB: 1.0000 | INHALATION_SPRAY | Freq: Two times a day (BID) | RESPIRATORY_TRACT | Status: DC

## 2013-02-15 NOTE — Progress Notes (Signed)
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   HPI February 20, 2009 cc sob x 1.5 years to point where sometimes can't get across a large room other times can do mall walking and house work with no discernable pattern. No nocturnal or early am cough or congestion but occasionally develops harsh upper airway cough with deep insp  rec symbicort 160      06/18/2010 ov / [name] cc doe x heavy exertion only. No cough.  Sleeping ok without nocturnal  or early am exac of resp c/o's or need for noct saba.  varible heartburn, throat congestion on ACE x years. Rec Work on inhaler technique:  Stop lisinopril and try diovan 160/12.5 mg in its place for a 6 week trial you may notice less throat irritation, less heartburn, better breathing and less need for rescue inhalters.    09/20/2012 f/u ov/[name]  Chief Complaint  Patient presents with  . Follow-up    pt reports overall breathing is doing well-- having some SOB this morning since walkin into building-- denies any other concerns at this itme  Rare need for saba ventolin while on symbicort 160 2bid - only problems walking outside in humid weather or inclines, does fine flat surfaces indoors. rec Continue symbicort 160 Take 2 puffs first thing in am and then another 2 puffs about 12 hours later.  Only use your albuterol as a rescue medication   02/15/2013 f/u ov/[name] re:  GOLD III COPD Chief Complaint  Patient presents with  . COPD    Increased sob x2-3 days.  SATS 82% upon arival   Uses ventolin sev times per day on avg and no worse lately  No noct need for saba   No obvious daytime variabilty or assoc chronic cough or cp or chest tightness, subjective wheeze overt sinus or hb symptoms. No  unusual exp hx or h/o childhood pna/ asthma or knowledge of premature birth.   Sleeping ok without nocturnal  or early am exacerbation  of respiratory  c/o's or need for noct saba. Also denies any obvious fluctuation of symptoms with weather or environmental changes or other aggravating or alleviating factors except as outlined above   ROS  The following are not active complaints unless bolded sore throat, dysphagia, dental problems, itching, sneezing,  nasal congestion or excess/ purulent secretions, ear ache,   fever, chills, sweats, unintended wt loss, pleuritic or exertional cp, hemoptysis,  orthopnea pnd or leg swelling, presyncope, palpitations, heartburn, abdominal pain, anorexia, nausea, vomiting, diarrhea  or change in bowel or urinary habits, change in stools or urine, dysuria,hematuria,  rash, arthralgias, visual complaints, headache, numbness weakness or ataxia or problems with walking or coordination,  change in mood/affect or memory.        Past Medical History:  HYPERTENSION (ICD-401.9)  COPD (ICD-496)  - PFT's February 20, 2009 FEV .62 (32%) ratio 38  - HFA 75% 09/14/2011  - PFT's March 27, 2009 FEV1 .85 (40%) 44% and no better adfter B2 and DLC0 39%  ? childhood asthma            Objective:   Physical Exam    very pleasant amb bf nad   wt 160 March 27, 2009 >  161 Jun 24, 2009 > 139 06/18/2010 > 09/14/2011  146> 09/20/2012  142> 02/15/2013 139  HEENT mild turbinate edema. Oropharynx no thrush or excess pnd or cobblestoning. No JVD or cervical adenopathy. Mild accessory muscle hypertrophy. Trachea midline, nl thryroid. Chest was hyperinflated by percussion with diminished breath sounds and moderate increased exp time with trace late exp wheezes bilaterally. Hoover sign positive at mid inspiration. Regular rate and rhythm without murmur gallop or rub or increase P2 or edema. Abd: no hsm, nl excursion. Ext warm without cyanosis or clubbing.      CXR  02/15/2013 :   Did not  go for cxr    Assessment & Plan:

## 2013-02-15 NOTE — Patient Instructions (Addendum)
tudorza one twice daily in addition to your symbicort and should see improvement in breathing and less need for ventolin  Please schedule a follow up office visit in 6 weeks, call sooner if needed  Late add need cxr on return

## 2013-02-17 DIAGNOSIS — J9611 Chronic respiratory failure with hypoxia: Secondary | ICD-10-CM | POA: Insufficient documentation

## 2013-02-17 DIAGNOSIS — J9612 Chronic respiratory failure with hypercapnia: Secondary | ICD-10-CM

## 2013-02-17 NOTE — Assessment & Plan Note (Signed)
02/15/13 sats after walking 50 ft = 82% > offered amb 02 but declined

## 2013-02-17 NOTE — Assessment & Plan Note (Addendum)
-   PFT's February 20, 2009 FEV .62 (32%) ratio 38    GOLD III   PFT's March 27, 2009 FEV1 .85 (40%) 44% and no better after B2 and DLC0 39%  - Alpha one Antitrypsin 09/14/2011 >>  MM  Her desats on arrival are not surprising given her low dlco likely from emphysema in setting of GOLD III copd > offered 02 but not interested at this point but good candidate for lama  The proper method of use, as well as anticipated side effects, of a metered-dose inhaler are discussed and demonstrated to the patient. Improved effectiveness after extensive coaching during this visit to a level of approximately  90%  Rec  tudorza one twice daily trial

## 2013-03-29 ENCOUNTER — Ambulatory Visit: Payer: Medicare Other | Admitting: Internal Medicine

## 2013-05-01 ENCOUNTER — Ambulatory Visit: Payer: Medicare Other | Admitting: Internal Medicine

## 2013-05-17 ENCOUNTER — Ambulatory Visit: Payer: Medicare Other | Admitting: Internal Medicine

## 2013-05-19 ENCOUNTER — Telehealth: Payer: Self-pay | Admitting: Internal Medicine

## 2013-05-19 NOTE — Telephone Encounter (Signed)
[facility]  Called [employer] at [phone]  Pt ID number is Shela CommonsJ [id number]  Spoke with rep and she is to fax form to triage  She states that this can not be done over the phone  Will await the fax

## 2013-05-19 NOTE — Telephone Encounter (Signed)
Form placed in MW lookat to sign Note the pt does have f.u here 05/25/13

## 2013-05-22 ENCOUNTER — Telehealth: Payer: Self-pay | Admitting: Internal Medicine

## 2013-05-22 NOTE — Telephone Encounter (Signed)
Spoke with the pt  She has enough sample to last until her visit tomorrow and can get more then  I will hold msg to f/u on approval/denial

## 2013-05-22 NOTE — Telephone Encounter (Signed)
Done - we can give her samples in meantime if needed

## 2013-05-22 NOTE — Telephone Encounter (Signed)
Insurance company Just wanting dr to know that the tudirza was approved and that they will be sending an Artistapproval letter.Caren GriffinsStanley A Dalton

## 2013-05-23 ENCOUNTER — Telehealth: Payer: Self-pay | Admitting: Internal Medicine

## 2013-05-23 ENCOUNTER — Encounter: Payer: Self-pay | Admitting: Internal Medicine

## 2013-05-23 ENCOUNTER — Ambulatory Visit (INDEPENDENT_AMBULATORY_CARE_PROVIDER_SITE_OTHER)
Admission: RE | Admit: 2013-05-23 | Discharge: 2013-05-23 | Disposition: A | Discharge status: Home / Self Care | Payer: Medicare Other | Source: Ambulatory Visit | Attending: Internal Medicine | Admitting: Internal Medicine

## 2013-05-23 ENCOUNTER — Ambulatory Visit (INDEPENDENT_AMBULATORY_CARE_PROVIDER_SITE_OTHER): Payer: Medicare Other | Admitting: Internal Medicine

## 2013-05-23 VITALS — BP 130/80 | HR 102 | Temp 98.4°F | Ht 62.0 in | Wt 135.0 lb

## 2013-05-23 DIAGNOSIS — J449 Chronic obstructive pulmonary disease, unspecified: Secondary | ICD-10-CM

## 2013-05-23 DIAGNOSIS — J9610 Chronic respiratory failure, unspecified whether with hypoxia or hypercapnia: Secondary | ICD-10-CM

## 2013-05-23 MED ORDER — ACLIDINIUM BROMIDE 400 MCG/ACT IN AEPB: 1.0000 | INHALATION_SPRAY | Freq: Two times a day (BID) | RESPIRATORY_TRACT | Status: DC

## 2013-05-23 NOTE — Patient Instructions (Addendum)
No changes in medications needed  Please schedule a follow up visit in 6 months but call sooner if needed

## 2013-05-23 NOTE — Telephone Encounter (Signed)
Pt is aware of her chest xray results. Needs refill on Tudorza. This will be sent in for her.

## 2013-05-23 NOTE — Progress Notes (Signed)
Quick Note:  LMTCB ______ 

## 2013-05-23 NOTE — Progress Notes (Signed)
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   HPI February 20, 2009 cc sob x 1.5 years to point where sometimes can't get across a large room other times can do mall walking and house work with no discernable pattern. No nocturnal or early am cough or congestion but occasionally develops harsh upper airway cough with deep insp  rec symbicort 160      06/18/2010 ov / Livier Hendel cc doe x heavy exertion only. No cough.  Sleeping ok without nocturnal  or early am exac of resp c/o's or need for noct saba.  varible heartburn, throat congestion on ACE x years. Rec Work on inhaler technique:  Stop lisinopril and try diovan 160/12.5 mg in its place for a 6 week trial you may notice less throat irritation, less heartburn, better breathing and less need for rescue inhalters.    09/20/2012 f/u ov/Beatrice Ziehm  Chief Complaint  Patient presents with  . Follow-up    pt reports overall breathing is doing well-- having some SOB this morning since walkin into building-- denies any other concerns at this itme  Rare need for saba ventolin while on symbicort 160 2bid - only problems walking outside in humid weather or inclines, does fine flat surfaces indoors. rec Continue symbicort 160 Take 2 puffs first thing in am and then another 2 puffs about 12 hours later.  Only use your albuterol as a rescue medication   02/15/2013 f/u ov/Ziva Nunziata re:  GOLD III COPD Chief Complaint  Patient presents with  . COPD    Increased sob x2-3 days.  SATS 82% upon arival  Uses ventolin sev times per day on avg and no worse lately  No noct need for saba  rec tudorza one twice daily in addition to your symbicort and should see improvement in breathing and less need for  ventolin   05/23/2013 f/u ov/Nathon Stefanski re:  Copd GOLD III Chief Complaint  Patient presents with  . Follow-up    Pt states that her breathing has improved since last visit. Using proair 1 to 2 times per day.     Uses proair if over does houseworking, does flat paced walking fine       No obvious daytime variabilty or assoc chronic cough or cp or chest tightness, subjective wheeze overt sinus or hb symptoms. No unusual exp hx or h/o childhood pna/ asthma or knowledge of premature birth.   Sleeping ok without nocturnal  or early am exacerbation  of respiratory  c/o's or need for noct saba. Also denies any obvious fluctuation of symptoms with weather or environmental changes or other aggravating or alleviating factors except as outlined above   ROS  The following are not active complaints unless bolded sore throat, dysphagia, dental problems, itching, sneezing,  nasal congestion or excess/ purulent secretions, ear ache,   fever, chills, sweats, unintended wt loss, pleuritic or exertional cp, hemoptysis,  orthopnea pnd or leg swelling, presyncope, palpitations, heartburn, abdominal pain, anorexia, nausea, vomiting, diarrhea  or change in bowel or urinary habits, change in stools or urine, dysuria,hematuria,  rash, arthralgias, visual complaints, headache, numbness weakness or ataxia or problems with walking or coordination,  change in mood/affect or memory.  Past Medical History:  HYPERTENSION (ICD-401.9)  COPD (ICD-496)  - PFT's February 20, 2009 FEV .62 (32%) ratio 38  - HFA 75% 09/14/2011  - PFT's March 27, 2009 FEV1 .85 (40%) 44% and no better adfter B2 and DLC0 39%  ? childhood asthma            Objective:   Physical Exam    very pleasant amb bf nad   wt 160 March 27, 2009 > 161 Jun 24, 2009 > 139 06/18/2010 > 09/14/2011  146> 09/20/2012  142> 02/15/2013 139 > 05/27/2013  135  HEENT mild turbinate edema. Oropharynx no thrush or excess pnd or cobblestoning. No JVD or cervical  adenopathy. Mild accessory muscle hypertrophy. Trachea midline, nl thryroid. Chest was hyperinflated by percussion with diminished breath sounds and moderate increased exp time with trace late exp wheezes bilaterally. Hoover sign positive at mid inspiration. Regular rate and rhythm without murmur gallop or rub or increase P2 or edema. Abd: no hsm, nl excursion. Ext warm without cyanosis or clubbing.       CXR   05/23/13 COPD.  No acute abnormalities     Assessment & Plan:

## 2013-05-23 NOTE — Telephone Encounter (Signed)
error 

## 2013-05-27 NOTE — Assessment & Plan Note (Signed)
-   PFT's February 20, 2009 FEV .62 (32%) ratio 38    GOLD III   PFT's March 27, 2009 FEV1 .85 (40%) 44% and no better after B2 and DLC0 39%  - Alpha one Antitrypsin 09/14/2011 >>  MM - 02/15/2013 p extensive coaching HFA effectiveness =    90% > added tudorza and improved   Adequate control on present rx, reviewed > no change in rx needed      Each maintenance medication was reviewed in detail including most importantly the difference between maintenance and as needed and under what circumstances the prns are to be used.  Please see instructions for details which were reviewed in writing and the patient given a copy.

## 2013-05-27 NOTE — Assessment & Plan Note (Signed)
02/15/13 sats after walking 50 ft = 82% > offered amb 02 but declined -  05/26/2013   Walked RA x one lap @ 185 stopped due to desat to 86% but declined 02 again

## 2013-10-03 ENCOUNTER — Other Ambulatory Visit: Payer: Self-pay | Admitting: Internal Medicine

## 2013-10-05 ENCOUNTER — Telehealth: Payer: Self-pay | Admitting: Internal Medicine

## 2013-10-05 NOTE — Telephone Encounter (Signed)
Called and spoke with pt and she is aware of MW message.  Nothing further is needed.

## 2013-10-05 NOTE — Telephone Encounter (Signed)
Called and spoke with pt and she does not need any further forms filled and is very grateful to [facility] for filling the last ones out.  Pt did need to know if these forms were mailed back to her or to the insurance company.  MW please advise. thanks

## 2013-10-05 NOTE — Telephone Encounter (Signed)
Can't recall sorry but they were completed as soon as I received them

## 2013-10-05 NOTE — Telephone Encounter (Signed)
Spoke with pt and she states that Dr wert called her last week and told her he would mail the insurance forms [employer])  once he completed them.  Pt wants to know if this has been mailed and if not she has more papers she needs to send with them.  Dr Sherene Sires, please advise if you have completed the forms.

## 2013-10-05 NOTE — Telephone Encounter (Signed)
They did come across my desk but I'd be happy to look at any more she needs me for

## 2013-11-28 ENCOUNTER — Ambulatory Visit: Payer: Medicare Other | Admitting: Internal Medicine

## 2013-12-19 ENCOUNTER — Ambulatory Visit (INDEPENDENT_AMBULATORY_CARE_PROVIDER_SITE_OTHER): Payer: Medicare Other | Admitting: Internal Medicine

## 2013-12-19 ENCOUNTER — Encounter: Payer: Self-pay | Admitting: Internal Medicine

## 2013-12-19 VITALS — BP 138/84 | HR 89 | Ht 63.0 in | Wt 139.2 lb

## 2013-12-19 DIAGNOSIS — J449 Chronic obstructive pulmonary disease, unspecified: Secondary | ICD-10-CM

## 2013-12-19 NOTE — Patient Instructions (Addendum)
Work on inhaler technique:  relax and gently blow all the way out then take a nice smooth deep breath back in, triggering the inhaler at same time you start breathing in.  Hold for up to 5 seconds if you can.  Rinse and gargle with water when done     Please schedule a follow up visit in 6 months but call sooner if needed Copy to Russ HaloAnelia Ross, MD

## 2013-12-19 NOTE — Progress Notes (Signed)
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   HPI February 20, 2009 cc sob x 1.5 years to point where sometimes can't get across a large room other times can do mall walking and house work with no discernable pattern. No nocturnal or early am cough or congestion but occasionally develops harsh upper airway cough with deep insp  rec symbicort 160      06/18/2010 ov / [name] cc doe x heavy exertion only. No cough.  Sleeping ok without nocturnal  or early am exac of resp c/o's or need for noct saba.  varible heartburn, throat congestion on ACE x years. Rec Work on inhaler technique:  Stop lisinopril and try diovan 160/12.5 mg in its place for a 6 week trial you may notice less throat irritation, less heartburn, better breathing and less need for rescue inhalters.    09/20/2012 f/u ov/[name]  Chief Complaint  Patient presents with  . Follow-up    pt reports overall breathing is doing well-- having some SOB this morning since walkin into building-- denies any other concerns at this itme  Rare need for saba ventolin while on symbicort 160 2bid - only problems walking outside in humid weather or inclines, does fine flat surfaces indoors. rec Continue symbicort 160 Take 2 puffs first thing in am and then another 2 puffs about 12 hours later.  Only use your albuterol as a rescue medication   02/15/2013 f/u ov/[name] re:  GOLD III COPD Chief Complaint  Patient presents with  . COPD    Increased sob x2-3 days.  SATS 82% upon arival  Uses ventolin sev times per day on avg and no worse lately  No noct need for saba  rec tudorza one twice daily in addition to your symbicort and should see improvement in breathing and less need for  ventolin    12/19/2013 f/u ov/[name] re:  GOLD III maint on symbicort and tudorza Chief Complaint  Patient presents with  . Follow-up    Pt states that her breathing is overall doing well. She is using ventolin 2-3 times per day.   only using ventolin with over exertion / never noct  No obvious daytime variabilty or assoc chronic cough or cp or chest tightness, subjective wheeze overt sinus or hb symptoms. No unusual exp hx or h/o childhood pna/ asthma or knowledge of premature birth.   Sleeping ok without nocturnal  or early am exacerbation  of respiratory  c/o's or need for noct saba. Also denies any obvious fluctuation of symptoms with weather or environmental changes or other aggravating or alleviating factors except as outlined above   ROS  The following are not active complaints unless bolded sore throat, dysphagia, dental problems, itching, sneezing,  nasal congestion or excess/ purulent secretions, ear ache,   fever, chills, sweats, unintended wt loss, pleuritic or exertional cp, hemoptysis,  orthopnea pnd or leg swelling, presyncope, palpitations, heartburn, abdominal pain, anorexia, nausea, vomiting, diarrhea  or change in bowel or urinary habits, change in stools or urine, dysuria,hematuria,  rash, arthralgias, visual complaints, headache, numbness weakness or ataxia or problems with walking or coordination,  change in mood/affect or memory.        Past Medical History:  HYPERTENSION (ICD-401.9)  COPD (ICD-496)  - PFT's February 20, 2009 FEV .62 (32%) ratio 38  - HFA 75% 09/14/2011  - PFT's March 27, 2009 FEV1 .85 (40%) 44% and no better adfter B2 and DLC0 39%  ? childhood asthma            Objective:   Physical Exam    very pleasant amb bf nad   wt 160 March 27, 2009 > 161 Jun 24, 2009 > 139 06/18/2010 > 09/14/2011  146> 09/20/2012  142> 02/15/2013 139 > 05/27/2013  135 > 12/19/2013 139  HEENT mild turbinate edema. Oropharynx no thrush or excess pnd or cobblestoning. No  JVD or cervical adenopathy. Mild accessory muscle hypertrophy. Trachea midline, nl thryroid. Chest was hyperinflated by percussion with diminished breath sounds and moderate increased exp time with trace late exp wheezes bilaterally. Hoover sign positive at mid inspiration. Regular rate and rhythm without murmur gallop or rub or increase P2 or edema. Abd: no hsm, nl excursion. Ext warm without cyanosis or clubbing.       CXR   05/23/13 COPD.  No acute abnormalities     Assessment & Plan:

## 2013-12-21 NOTE — Assessment & Plan Note (Signed)
Quit smoking 2003  - daughter is [name], md [place] - PFT's February 20, 2009 FEV .62 (32%) ratio 38    GOLD III   PFT's March 27, 2009 FEV1 .85 (40%) 44% and no better after B2 and DLC0 39%  - Alpha one Antitrypsin 09/14/2011 >>  MM - 02/15/2013   added tudorza and improved    Only concern is tendency to overuse saba with exertion > reviewed approp use (prn can't catch her breath rather than doe which can be controlled by pacing/resting/ purse lip  The proper method of use, as well as anticipated side effects, of a metered-dose inhaler are discussed and demonstrated to the patient. Improved effectiveness after extensive coaching during this visit to a level of approximately  75% so still needs work to get full benefit of hfa symbicort    Each maintenance medication was reviewed in detail including most importantly the difference between maintenance and as needed and under what circumstances the prns are to be used.  Please see instructions for details which were reviewed in writing and the patient given a copy.

## 2014-01-26 ENCOUNTER — Other Ambulatory Visit: Payer: Self-pay | Admitting: Internal Medicine

## 2014-02-18 ENCOUNTER — Other Ambulatory Visit: Payer: Self-pay | Admitting: Internal Medicine

## 2014-05-14 ENCOUNTER — Ambulatory Visit (INDEPENDENT_AMBULATORY_CARE_PROVIDER_SITE_OTHER): Payer: Medicare Other | Admitting: Internal Medicine

## 2014-05-14 ENCOUNTER — Encounter: Payer: Self-pay | Admitting: Internal Medicine

## 2014-05-14 ENCOUNTER — Telehealth: Payer: Self-pay | Admitting: Internal Medicine

## 2014-05-14 ENCOUNTER — Ambulatory Visit: Payer: Self-pay | Admitting: Pulmonary Disease

## 2014-05-14 ENCOUNTER — Other Ambulatory Visit (INDEPENDENT_AMBULATORY_CARE_PROVIDER_SITE_OTHER): Payer: Medicare Other

## 2014-05-14 VITALS — BP 140/84 | HR 92 | Temp 98.2°F | Ht 62.0 in | Wt 138.0 lb

## 2014-05-14 DIAGNOSIS — R0600 Dyspnea, unspecified: Secondary | ICD-10-CM

## 2014-05-14 DIAGNOSIS — J449 Chronic obstructive pulmonary disease, unspecified: Secondary | ICD-10-CM

## 2014-05-14 LAB — BASIC METABOLIC PANEL
BUN: 18 mg/dL (ref 6–23)
CHLORIDE: 98 meq/L (ref 96–112)
CO2: 34 meq/L — AB (ref 19–32)
Calcium: 9.5 mg/dL (ref 8.4–10.5)
Creatinine, Ser: 0.7 mg/dL (ref 0.40–1.20)
GFR: 108.28 mL/min (ref >60.00)
GLUCOSE: 98 mg/dL (ref 70–99)
POTASSIUM: 3.4 meq/L — AB (ref 3.5–5.1)
SODIUM: 141 meq/L (ref 135–145)

## 2014-05-14 LAB — CBC WITH DIFFERENTIAL/PLATELET
BASOS ABS: 0 10*3/uL (ref 0.0–0.1)
Basophils Relative: 0.2 % (ref 0.0–3.0)
EOS ABS: 0 10*3/uL (ref 0.0–0.7)
Eosinophils Relative: 0.3 % (ref 0.0–5.0)
HCT: 48.3 % — ABNORMAL HIGH (ref 36.0–46.0)
Hemoglobin: 16.1 g/dL — ABNORMAL HIGH (ref 12.0–15.0)
LYMPHS PCT: 11 % — AB (ref 12.0–46.0)
Lymphs Abs: 1.1 10*3/uL (ref 0.7–4.0)
MCHC: 33.4 g/dL (ref 30.0–36.0)
MCV: 88.9 fl (ref 78.0–100.0)
MONOS PCT: 2.9 % — AB (ref 3.0–12.0)
Monocytes Absolute: 0.3 10*3/uL (ref 0.1–1.0)
Neutro Abs: 8.8 10*3/uL — ABNORMAL HIGH (ref 1.4–7.7)
Neutrophils Relative %: 85.6 % — ABNORMAL HIGH (ref 43.0–77.0)
PLATELETS: 265 10*3/uL (ref 150.0–400.0)
RBC: 5.43 Mil/uL — ABNORMAL HIGH (ref 3.87–5.11)
RDW: 13.9 % (ref 11.5–15.5)
WBC: 10.3 10*3/uL (ref 4.0–10.5)

## 2014-05-14 LAB — BRAIN NATRIURETIC PEPTIDE: Pro B Natriuretic peptide (BNP): 17 pg/mL (ref 0.0–100.0)

## 2014-05-14 LAB — TSH: TSH: 0.96 u[IU]/mL (ref 0.35–4.50)

## 2014-05-14 MED ORDER — ALBUTEROL SULFATE (2.5 MG/3ML) 0.083% IN NEBU: 2.5000 mg | INHALATION_SOLUTION | RESPIRATORY_TRACT | Status: AC | PRN

## 2014-05-14 NOTE — Patient Instructions (Addendum)
Until you are better I recommend you take prilosec x 2  X 30 min before bfast and supper   GERD (REFLUX)  is an extremely common cause of respiratory symptoms just like yours , many times with no obvious heartburn at all.    It can be treated with medication, but also with lifestyle changes including avoidance of late meals, excessive alcohol, smoking cessation, and avoid fatty foods, chocolate, peppermint, colas, red wine, and acidic juices such as orange juice.  NO MINT OR MENTHOL PRODUCTS SO NO COUGH DROPS  USE SUGARLESS CANDY INSTEAD (Jolley ranchers or Stover's or Life Savers) or even ice chips will also do - the key is to swallow to prevent all throat clearing. NO OIL BASED VITAMINS - use powdered substitutes.    Please remember to go to the lab department downstairs for your tests - we will call you with the results when they are available.  Plan A = automatic =  symbicort and Tudorza  Plan B = backup  Only use your albuterol as a rescue medication to be used if you can't catch your breath by resting or doing a relaxed purse lip breathing pattern.  - The less you use it, the better it will work when you need it. - Ok to use up to 2 puffs  every 4 hours if you must but call for immediate appointment if use goes up over your usual need - Don't leave home without it !!  (think of it like the spare tire for your car)     Plan C = Nebulizer = albuterol neb is up to every 4 hours as needed (when you finish the combination change to pure albuterol)   Please schedule a follow up office visit in 2 weeks, sooner if needed

## 2014-05-14 NOTE — Telephone Encounter (Signed)
Spoke with Dr Okey Dupreose; she is aware that MW had opening at 10:45 am this morning and will make sure the patient is here then. Pt is having SOB with short distance walking and daughter(Dr Contino) feels like patient needs to see MW to see if she needs different meds or O2 at night,etc.

## 2014-05-14 NOTE — Progress Notes (Signed)
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   HPI February 20, 2009 cc sob x 1.5 years to point where sometimes can't get across a large room other times can do mall walking and house work with no discernable pattern. No nocturnal or early am cough or congestion but occasionally develops harsh upper airway cough with deep insp  rec symbicort 160      06/18/2010 ov / [name] cc doe x heavy exertion only. No cough.  Sleeping ok without nocturnal  or early am exac of resp c/o's or need for noct saba.  varible heartburn, throat congestion on ACE x years. Rec Work on inhaler technique:  Stop lisinopril and try diovan 160/12.5 mg in its place for a 6 week trial you may notice less throat irritation, less heartburn, better breathing and less need for rescue inhalters.    09/20/2012 f/u ov/[name]  Chief Complaint  Patient presents with  . Follow-up    pt reports overall breathing is doing well-- having some SOB this morning since walkin into building-- denies any other concerns at this itme  Rare need for saba ventolin while on symbicort 160 2bid - only problems walking outside in humid weather or inclines, does fine flat surfaces indoors. rec Continue symbicort 160 Take 2 puffs first thing in am and then another 2 puffs about 12 hours later.  Only use your albuterol as a rescue medication   02/15/2013 f/u ov/[name] re:  GOLD III COPD Chief Complaint  Patient presents with  . COPD    Increased sob x2-3 days.  SATS 82% upon arival  Uses ventolin sev times per day on avg and no worse lately  No noct need for saba  rec tudorza one twice daily in addition to your symbicort and should see improvement in breathing and less need for  ventolin    12/19/2013 f/u ov/[name] re:  GOLD III maint on symbicort and tudorza Chief Complaint  Patient presents with  . Follow-up    Pt states that her breathing is overall doing well. She is using ventolin 2-3 times per day.   only using ventolin with over exertion / never noct rec Work on inhaler technique:        05/14/2014 f/u ov/[name] re: aecopd  Chief Complaint  Patient presents with  . Acute Visit    Pt c/o increased SOB since 04/16/13- was dxed with PNA that date. She states that she felt better for 1-2 wks, and then started having trouble again. She states that she gets out of breath walking from room to room at home.  Has occ cough "not really bothering me". Sputum is clear. She is using ventolin on average 4 x per day and was started on dunoeb qid.    onset was acute, transiently better after rx as CAP then gradually worse and requiring much more saba than baseline Already on pred and doxy   No obvious daytime variabilty or assoc chronic cp or chest tightness, subjective wheeze overt sinus or hb symptoms. No unusual exp hx or h/o childhood pna/ asthma or knowledge of premature birth.   Sleeping ok without nocturnal  or early am exacerbation  of respiratory  c/o's or need for  noct saba. Also denies any obvious fluctuation of symptoms with weather or environmental changes or other aggravating or alleviating factors except as outlined above   ROS  The following are not active complaints unless bolded sore throat, dysphagia, dental problems, itching, sneezing,  nasal congestion or excess/ purulent secretions, ear ache,   fever, chills, sweats, unintended wt loss, pleuritic or exertional cp, hemoptysis,  orthopnea pnd or leg swelling, presyncope, palpitations, heartburn, abdominal pain, anorexia, nausea, vomiting, diarrhea  or change in bowel or urinary habits, change in stools or urine, dysuria,hematuria,  rash, arthralgias, visual complaints, headache, numbness weakness or ataxia or  problems with walking or coordination,  change in mood/affect or memory.        Past Medical History:  HYPERTENSION (ICD-401.9)  COPD (ICD-496)  - PFT's February 20, 2009 FEV .62 (32%) ratio 38  - HFA 75% 09/14/2011  - PFT's March 27, 2009 FEV1 .85 (40%) 44% and no better adfter B2 and DLC0 39%  ? childhood asthma            Objective:   Physical Exam    very pleasant amb bf nad   wt 160 March 27, 2009 > 161 Jun 24, 2009 > 139 06/18/2010 > 09/14/2011  146> 09/20/2012  142> 02/15/2013 139 > 05/27/2013  135 > 12/19/2013 139 > 05/14/14  138 HEENT mild turbinate edema. Oropharynx no thrush or excess pnd or cobblestoning. No JVD or cervical adenopathy. Mild accessory muscle hypertrophy. Trachea midline, nl thryroid. Chest was hyperinflated by percussion with diminished breath sounds and moderate increased exp time with trace late exp wheezes bilaterally. Hoover sign positive at mid inspiration. Regular rate and rhythm without murmur gallop or rub or increase P2 or edema. Abd: no hsm, nl excursion. Ext warm without cyanosis or clubbing.        Labs ordered/ reviewed     Recent Labs Lab 05/14/14 1132  NA 141  K 3.4*  CL 98  CO2 34*  BUN 18  CREATININE 0.70  GLUCOSE 98    Recent Labs Lab 05/14/14 1132  HGB 16.1*  HCT 48.3*  WBC 10.3  PLT 265.0     Lab Results  Component Value Date   TSH 0.96 05/14/2014     Lab Results  Component Value Date   PROBNP 17.0 05/14/2014      Assessment & Plan:

## 2014-05-15 ENCOUNTER — Telehealth: Payer: Self-pay | Admitting: Internal Medicine

## 2014-05-15 MED ORDER — OMEPRAZOLE 20 MG PO CPDR: 40.0000 mg | DELAYED_RELEASE_CAPSULE | Freq: Two times a day (BID) | ORAL | Status: DC

## 2014-05-15 NOTE — Progress Notes (Signed)
Quick Note:  Spoke with pt and notified of results per Dr. Wert. Pt verbalized understanding and denied any questions.  ______ 

## 2014-05-15 NOTE — Telephone Encounter (Signed)
Pt calling states that at her appt with MW 05/14/14 he changed the instructions to her Omeprazole Rx to 2 pills BID. Pt needing Rx called into pharmacy.  Patient Instructions     Until you are better I recommend you take prilosec x 2 X 30 min before bfast and supper   GERD (REFLUX) is an extremely common cause of respiratory symptoms just like yours , many times with no obvious heartburn at all.   It can be treated with medication, but also with lifestyle changes including avoidance of late meals, excessive alcohol, smoking cessation, and avoid fatty foods, chocolate, peppermint, colas, red wine, and acidic juices such as orange juice.  NO MINT OR MENTHOL PRODUCTS SO NO COUGH DROPS  USE SUGARLESS CANDY INSTEAD (Jolley ranchers or Stover's or Life Savers) or even ice chips will also do - the key is to swallow to prevent all throat clearing. NO OIL BASED VITAMINS - use powdered substitutes.   Please remember to go to the lab department downstairs for your tests - we will call you with the results when they are available.  Plan A = automatic = symbicort and Tudorza  Plan B = backup  Only use your albuterol as a rescue medication to be used if you can't catch your breath by resting or doing a relaxed purse lip breathing pattern.  - The less you use it, the better it will work when you need it. - Ok to use up to 2 puffs every 4 hours if you must but call for immediate appointment if use goes up over your usual need - Don't leave home without it !! (think of it like the spare tire for your car)   Plan C = Nebulizer = albuterol neb is up to every 4 hours as needed (when you finish the combination change to pure albuterol)   Please schedule a follow up office visit in 2 weeks, sooner if needed    Pt aware that Rx has been sent with the changes.  Nothing further needed.

## 2014-05-16 ENCOUNTER — Other Ambulatory Visit: Payer: Self-pay | Admitting: Internal Medicine

## 2014-05-16 ENCOUNTER — Telehealth: Payer: Self-pay | Admitting: *Deleted

## 2014-05-16 DIAGNOSIS — R0600 Dyspnea, unspecified: Secondary | ICD-10-CM

## 2014-05-16 LAB — D-DIMER, QUANTITATIVE (NOT AT ARMC)

## 2014-05-16 NOTE — Telephone Encounter (Signed)
[facility] that the [facility] provided  [facility]

## 2014-05-16 NOTE — Telephone Encounter (Signed)
PA request received on 05/15/14 for Omeprazole 20 mg 2 am/2pm. PA initiated thru cover my meds today.

## 2014-05-17 ENCOUNTER — Telehealth: Payer: Self-pay | Admitting: Internal Medicine

## 2014-05-17 MED ORDER — OMEPRAZOLE 20 MG PO CPDR: 40.0000 mg | DELAYED_RELEASE_CAPSULE | Freq: Two times a day (BID) | ORAL | Status: DC

## 2014-05-17 NOTE — Telephone Encounter (Signed)
Spoke with Dr. Dena BilletSuzie Charity. Pt needs refill on Omeprazole. MW increased this to 2 tablets BID. This has been sent in. Nothing further was needed.

## 2014-05-20 ENCOUNTER — Encounter: Payer: Self-pay | Admitting: Internal Medicine

## 2014-05-20 NOTE — Assessment & Plan Note (Addendum)
Quit smoking 2003  - daughter is Edwena FeltyAnelia Olivar, md [place] - PFT's February 20, 2009 FEV .62 (32%) ratio 38    GOLD III   PFT's March 27, 2009 FEV1 .85 (40%) 44% and no better after B2 and DLC0 39%  - Alpha one Antitrypsin 09/14/2011 >>  MM - 02/15/2013   added tudorza and improved     . 05/14/2014 p extensive coaching HFA effectiveness =    75%   I had an extended discussion with the patient reviewing all relevant studies completed to date and  lasting 15 to 20 minutes of a 25 minute visit on the following ongoing concerns:  1)  As her breathing gets worse it's hard for her to inhale as effectively due to limited IC from air trapping and the key is relax and breath all the way out  2) since already on abx/ pred rec add max gerd rx:    always difficult to exclude GERD  as up to 75% of pts in some series report no assoc GI/ Heartburn symptoms> rec max (24h)  acid suppression and diet restrictions/ reviewed and instructions given in writing.   3) Each maintenance medication was reviewed in detail including most importantly the difference between maintenance and as needed and under what circumstances the prns are to be used.  Please see instructions for details which were reviewed in writing and the patient given a copy.    3) regroup in 2 weeks, sooner if needed

## 2014-05-20 NOTE — Assessment & Plan Note (Signed)
No other obvious cause for sob identified, see copd

## 2014-05-21 ENCOUNTER — Other Ambulatory Visit: Payer: Self-pay | Admitting: Internal Medicine

## 2014-05-23 NOTE — Telephone Encounter (Signed)
Request for Omeprazole 20 mg #120 was denied Patient plan will only pay for #30 per 30 days.

## 2014-05-24 ENCOUNTER — Telehealth: Payer: Self-pay | Admitting: Internal Medicine

## 2014-05-24 MED ORDER — OMEPRAZOLE 40 MG PO CPDR: 40.0000 mg | DELAYED_RELEASE_CAPSULE | Freq: Two times a day (BID) | ORAL | Status: DC

## 2014-05-24 NOTE — Telephone Encounter (Signed)
Should be able to rx with the omeprazole 40 bid or the pantoprazole 40 bid or whatever her formulary allows but that's all I can suggest at this point - she can do cash at [email or web address] and save a lot of money cash only / no insurance billing involved

## 2014-05-24 NOTE — Telephone Encounter (Signed)
Spoke with pt's daughter, pt is on omeprazole 20mg  2 tabs bid- this is helping well, but insurance is not going to cover more than 30 tabs per 30 days.  Pt has 10 days worth of medicine left.    Dr. Sherene SiresWert do you want to appeal this decision or do you have an alternative to recommend?  Thanks!

## 2014-05-24 NOTE — Telephone Encounter (Signed)
Spoke with pt's daughter and advised of Dr Thurston HoleWert's recommendations.  She would like to try calling in the Omeprazole 40 mg bid and see if they will cover this.  Rx sent.  Also given number for [facility].

## 2014-05-28 ENCOUNTER — Encounter: Payer: Self-pay | Admitting: Internal Medicine

## 2014-05-28 ENCOUNTER — Other Ambulatory Visit: Payer: Medicare Other

## 2014-05-28 ENCOUNTER — Ambulatory Visit (INDEPENDENT_AMBULATORY_CARE_PROVIDER_SITE_OTHER): Payer: Medicare Other | Admitting: Internal Medicine

## 2014-05-28 VITALS — BP 150/74 | HR 110 | Ht 62.0 in | Wt 137.0 lb

## 2014-05-28 DIAGNOSIS — R0600 Dyspnea, unspecified: Secondary | ICD-10-CM

## 2014-05-28 DIAGNOSIS — J449 Chronic obstructive pulmonary disease, unspecified: Secondary | ICD-10-CM | POA: Diagnosis not present

## 2014-05-28 NOTE — Patient Instructions (Signed)
Ok to reduce the prilosec to 20 mg Take 30- 60 min before your first and last meals of the day but increase back to 2  Before bfast and supper at the first sign of any flare of respiratory symptoms   Please schedule a follow up visit in 3 months but call sooner if needed

## 2014-05-28 NOTE — Progress Notes (Signed)
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   HPI February 20, 2009 cc sob x 1.5 years to point where sometimes can't get across a large room other times can do mall walking and house work with no discernable pattern. No nocturnal or early am cough or congestion but occasionally develops harsh upper airway cough with deep insp  rec symbicort 160      06/18/2010 ov / [name] cc doe x heavy exertion only. No cough.  Sleeping ok without nocturnal  or early am exac of resp c/o's or need for noct saba.  varible heartburn, throat congestion on ACE x years. Rec Work on inhaler technique:  Stop lisinopril and try diovan 160/12.5 mg in its place for a 6 week trial you may notice less throat irritation, less heartburn, better breathing and less need for rescue inhalters.    09/20/2012 f/u ov/[name]  Chief Complaint  Patient presents with  . Follow-up    pt reports overall breathing is doing well-- having some SOB this morning since walkin into building-- denies any other concerns at this itme  Rare need for saba ventolin while on symbicort 160 2bid - only problems walking outside in humid weather or inclines, does fine flat surfaces indoors. rec Continue symbicort 160 Take 2 puffs first thing in am and then another 2 puffs about 12 hours later.  Only use your albuterol as a rescue medication   02/15/2013 f/u ov/[name] re:  GOLD III COPD Chief Complaint  Patient presents with  . COPD    Increased sob x2-3 days.  SATS 82% upon arival  Uses ventolin sev times per day on avg and no worse lately  No noct need for saba  rec tudorza one twice daily in addition to your symbicort and should see improvement in breathing and less need for  ventolin    12/19/2013 f/u ov/[name] re:  GOLD III maint on symbicort and tudorza Chief Complaint  Patient presents with  . Follow-up    Pt states that her breathing is overall doing well. She is using ventolin 2-3 times per day.   only using ventolin with over exertion / never noct rec Work on inhaler technique:        05/14/2014 f/u ov/[name] re: aecopd  Chief Complaint  Patient presents with  . Acute Visit    Pt c/o increased SOB since 04/16/13- was dxed with PNA that date. She states that she felt better for 1-2 wks, and then started having trouble again. She states that she gets out of breath walking from room to room at home.  Has occ cough "not really bothering me". Sputum is clear. She is using ventolin on average 4 x per day and was started on dunoeb qid.   onset was acute, transiently better after rx as CAP then gradually worse and requiring much more saba than baseline Already on pred and doxy  rec Please remember to go to the lab department downstairs for your tests - we will call you with the results when they are available. Plan A = automatic =  symbicort and Tudorza Plan B = backup  Only use your albuterol   Plan C = Nebulizer = albuterol neb prn  05/28/2014 f/u ov/[name] re: copd GOLD III on tudorza and symbicort  Chief Complaint  Patient presents with  . Follow-up    Pt states that her breathing is much improved. She is only using rescue inhaler about once per wk and has not had to use neb.        No obvious daytime variabilty or assoc chronic cp or chest tightness, subjective wheeze overt sinus or hb symptoms. No unusual exp hx or h/o childhood pna/ asthma or knowledge of premature birth.   Sleeping ok without nocturnal  or early am exacerbation  of respiratory  c/o's or need for noct saba. Also denies any obvious fluctuation of symptoms with weather or environmental changes or other aggravating or alleviating factors except as outlined above   ROS  The following  are not active complaints unless bolded sore throat, dysphagia, dental problems, itching, sneezing,  nasal congestion or excess/ purulent secretions, ear ache,   fever, chills, sweats, unintended wt loss, pleuritic or exertional cp, hemoptysis,  orthopnea pnd or leg swelling, presyncope, palpitations, heartburn, abdominal pain, anorexia, nausea, vomiting, diarrhea  or change in bowel or urinary habits, change in stools or urine, dysuria,hematuria,  rash, arthralgias, visual complaints, headache, numbness weakness or ataxia or problems with walking or coordination,  change in mood/affect or memory.        Past Medical History:  HYPERTENSION (ICD-401.9)  COPD (ICD-496)  - PFT's February 20, 2009 FEV .62 (32%) ratio 38  - HFA 75% 09/14/2011  - PFT's March 27, 2009 FEV1 .85 (40%) 44% and no better adfter B2 and DLC0 39%  ? childhood asthma            Objective:   Physical Exam    very pleasant amb bf nad   wt 160 March 27, 2009 > 161 Jun 24, 2009 > 139 06/18/2010 > 09/14/2011  146> 09/20/2012  142> 02/15/2013 139 > 05/27/2013  135 > 12/19/2013 139 > 05/14/14  138 > 05/28/2014  137   HEENT mild turbinate edema. Oropharynx no thrush or excess pnd or cobblestoning. No JVD or cervical adenopathy. Mild accessory muscle hypertrophy. Trachea midline, nl thryroid. Chest was hyperinflated by percussion with diminished breath sounds and moderate increased exp time with no wheeze.  Hoover sign positive at mid inspiration. Regular rate and rhythm without murmur gallop or rub or increase P2 or edema. Abd: no hsm, nl excursion. Ext warm without cyanosis or clubbing.            labs ordered/ reviewed: Lab Results  Component Value Date   DDIMER 0.27 05/28/2014             Assessment & Plan:

## 2014-05-29 ENCOUNTER — Telehealth: Payer: Self-pay | Admitting: Internal Medicine

## 2014-05-29 LAB — D-DIMER, QUANTITATIVE: D-Dimer, Quant: 0.27 ug/mL-FEU (ref 0.00–0.48)

## 2014-05-29 NOTE — Telephone Encounter (Signed)
Patient notified of lab results. Nothing further needed.  

## 2014-05-30 ENCOUNTER — Encounter: Payer: Self-pay | Admitting: Internal Medicine

## 2014-05-30 NOTE — Assessment & Plan Note (Signed)
Quit smoking 2003  - daughter is Edwena FeltyAnelia Bugarin, md [place] - PFT's February 20, 2009 FEV .62 (32%) ratio 38    GOLD III   PFT's March 27, 2009 FEV1 .85 (40%) 44% and no better after B2 and DLC0 39%  - Alpha one Antitrypsin 09/14/2011 >>  MM - 02/15/2013   added tudorza and improved  . 05/14/2014 p extensive coaching HFA effectiveness =    75%  Improved with rx of GERD   rec try reduce prilosec 20 back to bid ac but increase to 40 mg bid with any flare

## 2014-08-03 ENCOUNTER — Telehealth: Payer: Self-pay | Admitting: Internal Medicine

## 2014-08-03 NOTE — Telephone Encounter (Signed)
Called [facility] in [place] to get PA information for [name] is not on the patient formulary. PA information is being faxed to triage - will HOLD in triage until received.   Left detailed message for patient letting her know that PA for [name] will be initiated as soon as we receive the PA info.  In the meantime, will send to Dr Sherene Sires to advise if he wants [place] to initiate PA or try alternative inhaler.

## 2014-08-03 NOTE — Telephone Encounter (Signed)
Give her samples x 6 weeks and return here with formulary to pick best alternative or fill out the PA form then, whichever proves necessary

## 2014-08-03 NOTE — Telephone Encounter (Signed)
Pt called   Pt informed that samples of tudorza were placed up front and can be picked up at any time M-F Pt also scheduled for a f/u appt with MW on 08/16/14 at 9:45am Pt also instructed to bring her formulary at the OV Pt voiced understanding  Nothing further is needed.

## 2014-08-03 NOTE — Telephone Encounter (Signed)
Pt returned call, can be reached at [phone].

## 2014-08-03 NOTE — Telephone Encounter (Signed)
Samples left up front for Tudorza X6 weeks per MW. LMTCB X1 for pt. Pt needs to be scheduled for rov with MW, needs to bring formulary to this visit.

## 2014-08-06 ENCOUNTER — Telehealth: Payer: Self-pay | Admitting: *Deleted

## 2014-08-06 ENCOUNTER — Telehealth: Payer: Self-pay | Admitting: Internal Medicine

## 2014-08-06 NOTE — Telephone Encounter (Signed)
PA RECEIVED FROM [facility] FOR TUDORZA. PA SUBMITTED VIA [email or web address] KEY: [id number] WILL AWAIT DECISION BLUE MEDICARE PART D PT ID: [id number]

## 2014-08-06 NOTE — Telephone Encounter (Signed)
Amy Martenachel S Atencio, LPN at 1/61/09606/24/2016 4:35 PM     Status: Signed       Expand All Collapse All   Pt called   Pt informed that samples of tudorza were placed up front and can be picked up at any time M-F Pt also scheduled for a f/u appt with MW on 08/16/14 at 9:45am Pt also instructed to bring her formulary at the OV Pt voiced understanding  Nothing further is needed.

## 2014-08-06 NOTE — Telephone Encounter (Signed)
Per prior phone note: [name], LPN at 4/09/81196/24/2016 4:35 PM     Status: Signed       Expand All Collapse All   Pt called   Pt informed that samples of tudorza were placed up front and can be picked up at any time M-F Pt also scheduled for a f/u appt with MW on 08/16/14 at 9:45am Pt also instructed to bring her formulary at the OV Pt voiced understanding  Nothing further is needed.

## 2014-08-07 ENCOUNTER — Telehealth: Payer: Self-pay | Admitting: Internal Medicine

## 2014-08-07 NOTE — Telephone Encounter (Signed)
PA for [place] was denied, hopefully patient will bring formulary to next office visit.

## 2014-08-08 ENCOUNTER — Other Ambulatory Visit: Payer: Self-pay | Admitting: Internal Medicine

## 2014-08-16 ENCOUNTER — Other Ambulatory Visit (INDEPENDENT_AMBULATORY_CARE_PROVIDER_SITE_OTHER): Payer: Medicare Other

## 2014-08-16 ENCOUNTER — Ambulatory Visit (INDEPENDENT_AMBULATORY_CARE_PROVIDER_SITE_OTHER)
Admission: RE | Admit: 2014-08-16 | Discharge: 2014-08-16 | Disposition: A | Discharge status: Home / Self Care | Payer: Medicare Other | Source: Ambulatory Visit | Attending: Internal Medicine | Admitting: Internal Medicine

## 2014-08-16 ENCOUNTER — Encounter: Payer: Self-pay | Admitting: Internal Medicine

## 2014-08-16 ENCOUNTER — Ambulatory Visit (INDEPENDENT_AMBULATORY_CARE_PROVIDER_SITE_OTHER): Payer: Medicare Other | Admitting: Internal Medicine

## 2014-08-16 VITALS — BP 168/92 | HR 100 | Ht 62.0 in | Wt 142.0 lb

## 2014-08-16 DIAGNOSIS — J449 Chronic obstructive pulmonary disease, unspecified: Secondary | ICD-10-CM

## 2014-08-16 DIAGNOSIS — J9611 Chronic respiratory failure with hypoxia: Secondary | ICD-10-CM | POA: Diagnosis not present

## 2014-08-16 LAB — CBC WITH DIFFERENTIAL/PLATELET
BASOS ABS: 0 10*3/uL (ref 0.0–0.1)
BASOS PCT: 0.5 % (ref 0.0–3.0)
EOS ABS: 0.1 10*3/uL (ref 0.0–0.7)
Eosinophils Relative: 0.9 % (ref 0.0–5.0)
HEMATOCRIT: 44 % (ref 36.0–46.0)
HEMOGLOBIN: 14.9 g/dL (ref 12.0–15.0)
LYMPHS PCT: 25.2 % (ref 12.0–46.0)
Lymphs Abs: 1.9 10*3/uL (ref 0.7–4.0)
MCHC: 33.8 g/dL (ref 30.0–36.0)
MCV: 89.4 fl (ref 78.0–100.0)
Monocytes Absolute: 0.3 10*3/uL (ref 0.1–1.0)
Monocytes Relative: 3.8 % (ref 3.0–12.0)
NEUTROS PCT: 69.6 % (ref 43.0–77.0)
Neutro Abs: 5.1 10*3/uL (ref 1.4–7.7)
Platelets: 246 10*3/uL (ref 150.0–400.0)
RBC: 4.92 Mil/uL (ref 3.87–5.11)
RDW: 13.8 % (ref 11.5–15.5)
WBC: 7.4 10*3/uL (ref 4.0–10.5)

## 2014-08-16 MED ORDER — TIOTROPIUM BROMIDE MONOHYDRATE 2.5 MCG/ACT IN AERS: INHALATION_SPRAY | RESPIRATORY_TRACT | Status: DC

## 2014-08-16 MED ORDER — PREDNISONE 10 MG PO TABS: ORAL_TABLET | ORAL | Status: DC

## 2014-08-16 NOTE — Patient Instructions (Addendum)
Stop tudorza and try spiriva respimat 2 puffs each am  Work on inhaler technique:  relax and gently blow all the way out then take a nice smooth deep breath back in, triggering the inhaler at same time you start breathing in.  Hold for up to 5 seconds if you can.  Rinse and gargle with water when done  Prednisone 10 mg take  4 each am x 2 days,   2 each am x 2 days,  1 each am x 2 days and stop  Please remember to go to the lab and x-ray department downstairs for your tests - we will call you with the results when they are available.    Please schedule a follow up office visit in 6 weeks, call sooner if needed with pfts on return

## 2014-08-16 NOTE — Progress Notes (Signed)
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   HPI February 20, 2009 cc sob x 1.5 years to point where sometimes can't get across a large room other times can do mall walking and house work with no discernable pattern. No nocturnal or early am cough or congestion but occasionally develops harsh upper airway cough with deep insp  rec symbicort 160      06/18/2010 ov / [name] cc doe x heavy exertion only. No cough.  Sleeping ok without nocturnal  or early am exac of resp c/o's or need for noct saba.  varible heartburn, throat congestion on ACE x years. Rec Work on inhaler technique:  Stop lisinopril and try diovan 160/12.5 mg in its place for a 6 week trial you may notice less throat irritation, less heartburn, better breathing and less need for rescue inhalters.    09/20/2012 f/u ov/[name]  Chief Complaint  Patient presents with  . Follow-up    pt reports overall breathing is doing well-- having some SOB this morning since walkin into building-- denies any other concerns at this itme  Rare need for saba ventolin while on symbicort 160 2bid - only problems walking outside in humid weather or inclines, does fine flat surfaces indoors. rec Continue symbicort 160 Take 2 puffs first thing in am and then another 2 puffs about 12 hours later.  Only use your albuterol as a rescue medication   02/15/2013 f/u ov/[name] re:  GOLD III COPD Chief Complaint  Patient presents with  . COPD    Increased sob x2-3 days.  SATS 82% upon arival  Uses ventolin sev times per day on avg and no worse lately  No noct need for saba  rec tudorza one twice daily in addition to your symbicort and should see improvement in breathing and less need for  ventolin    12/19/2013 f/u ov/[name] re:  GOLD III maint on symbicort and tudorza Chief Complaint  Patient presents with  . Follow-up    Pt states that her breathing is overall doing well. She is using ventolin 2-3 times per day.   only using ventolin with over exertion / never noct rec Work on inhaler technique:        05/14/2014 f/u ov/[name] re: aecopd  Chief Complaint  Patient presents with  . Acute Visit    Pt c/o increased SOB since 04/16/13- was dxed with PNA that date. She states that she felt better for 1-2 wks, and then started having trouble again. She states that she gets out of breath walking from room to room at home.  Has occ cough "not really bothering me". Sputum is clear. She is using ventolin on average 4 x per day and was started on dunoeb qid.   onset was acute, transiently better after rx as CAP then gradually worse and requiring much more saba than baseline Already on pred and doxy  rec Please remember to go to the lab department downstairs for your tests - we will call you with the results when they are available. Plan A = automatic =  symbicort and Tudorza Plan B = backup  Only use your albuterol   Plan C = Nebulizer = albuterol neb prn  05/28/2014 f/u ov/[name] re: copd GOLD III on tudorza and symbicort  Chief Complaint  Patient presents with  . Follow-up    Pt states that her breathing is much improved. She is only using rescue inhaler about once per wk and has not had to use neb.   rec Ok to reduce the prilosec to 20 mg Take 30- 60 min before your first and last meals of the day but increase back to 2  Before bfast and supper at the first sign of any flare of respiratory symptoms    08/16/2014 f/u ov/[name] re: GOLD III copd/ worse sob x weeks/ did not increase gerd rx as rec/ aint on symb 160 and tudorza  Chief Complaint  Patient presents with  . Follow-up    Pt states her breathing is unchanged. She is using her albuterol inhaler onn average 3 x per day  and had not used neb.   doe x more than slow adls/ MMRC 2  Only use albterol when overdoes/ no noct events Increased already the prilosec 40 Take 30- 60 min before your first and last meals of the day    No obvious daytime variabilty or assoc cough chronic cp or chest tightness, subjective wheeze overt sinus or hb symptoms. No unusual exp hx or h/o childhood pna/ asthma or knowledge of premature birth.   Sleeping ok without nocturnal  or early am exacerbation  of respiratory  c/o's or need for noct saba. Also denies any obvious fluctuation of symptoms with weather or environmental changes or other aggravating or alleviating factors except as outlined above   ROS  The following are not active complaints unless bolded sore throat, dysphagia, dental problems, itching, sneezing,  nasal congestion or excess/ purulent secretions, ear ache,   fever, chills, sweats, unintended wt loss, pleuritic or exertional cp, hemoptysis,  orthopnea pnd or leg swelling, presyncope, palpitations, heartburn, abdominal pain, anorexia, nausea, vomiting, diarrhea  or change in bowel or urinary habits, change in stools or urine, dysuria,hematuria,  rash, arthralgias, visual complaints, headache, numbness weakness or ataxia or problems with walking or coordination,  change in mood/affect or memory.        Past Medical History:  HYPERTENSION (ICD-401.9)  COPD (ICD-496)  - PFT's February 20, 2009 FEV .62 (32%) ratio 38  - HFA 75% 09/14/2011  - PFT's March 27, 2009 FEV1 .85 (40%) 44% and no better adfter B2 and DLC0 39%  ? childhood asthma            Objective:   Physical Exam    very pleasant amb bf nad slt hoarse  wt 160 March 27, 2009 > 161 Jun 24, 2009 > 139 06/18/2010 > 09/14/2011  146> 09/20/2012  142> 02/15/2013 139 > 05/27/2013  135 > 12/19/2013 139 > 05/14/14  138 > 05/28/2014  137>   08/16/2014  142    HEENT mild turbinate edema. Oropharynx no thrush or excess pnd or cobblestoning. No JVD or cervical  adenopathy. Mild accessory muscle hypertrophy. Trachea midline, nl thryroid. Chest was hyperinflated by percussion with diminished breath sounds and moderate increased exp time with trace mid exp bilateral  Wheeze.  Hoover sign positive at mid inspiration. Regular rate and rhythm without murmur gallop or rub or increase P2 or edema. Abd: no hsm, nl excursion. Ext warm without cyanosis or clubbing.      CXR PA and Lateral:   08/16/2014 :     I personally reviewed images and agree with radiology impression as follows:  COPD. There is no active cardiopulmonary disease.     labs ordered/ reviewed Allergy profile .08/16/14 >  IgE 67 neg RAST,  Eos 0.1   Assessment & Plan:

## 2014-08-16 NOTE — Progress Notes (Signed)
Quick Note:  Spoke with pt and notified of results per Dr. Wert. Pt verbalized understanding and denied any questions.  ______ 

## 2014-08-17 LAB — ALLERGY FULL PROFILE
Alternaria Alternata: <0.1 kU/L
Aspergillus fumigatus, m3: <0.1 kU/L
Bahia Grass: <0.1 kU/L
Bermuda Grass: <0.1 kU/L
Cat Dander: <0.1 kU/L
Common Ragweed: <0.1 kU/L
D. farinae: <0.1 kU/L
G009 Red Top: <0.1 kU/L
House Dust Hollister: <0.1 kU/L
IGE (IMMUNOGLOBULIN E), SERUM: 67 kU/L (ref <115)
Oak: <0.1 kU/L
Plantain: <0.1 kU/L
Sycamore Tree: <0.1 kU/L

## 2014-08-18 ENCOUNTER — Encounter: Payer: Self-pay | Admitting: Internal Medicine

## 2014-08-18 NOTE — Assessment & Plan Note (Addendum)
-    02/15/13 sats after walking 50 ft = 82% > offered amb 02 but declined -  05/26/2013   Walked RA x one lap @ 185 stopped due to desat to 86% but declined 02  -  08/16/2014  Walked RA x 1 laps @ 185 ft each stopped due to desat to 86% at nl pace/ no sob/ decline 02

## 2014-08-18 NOTE — Assessment & Plan Note (Addendum)
Quit smoking 2003  - daughter is Amy FeltyAnelia [place], [place]  - PFT's February 20, 2009 FEV .62 (32%) ratio 38    GOLD III   PFT's March 27, 2009 FEV1 .85 (40%) 44% and no better after B2 and DLC0 39%  - Alpha one Antitrypsin 09/14/2011 >>  MM - 02/15/2013   added tudorza and improved  . 05/14/2014 p extensive coaching HFA effectiveness =    75% - changed tudorza to spiriva respimat 08/16/14   Symptoms have been difficult to control of late. DDX of  difficult airways management all start with A and  include Adherence, Ace Inhibitors, Acid Reflux, Active Sinus Disease, Alpha 1 Antitripsin deficiency, Anxiety masquerading as Airways dz,  ABPA,  allergy(esp in young), Aspiration (esp in elderly), Adverse effects of meds,  Active smokers, A bunch of PE's (a small clot burden can't cause this syndrome unless there is already severe underlying pulm or vascular dz with poor reserve) plus two Bs  = Bronchiectasis and Beta blocker use..and one C= CHF  Adherence is always the initial "prime suspect" and is a multilayered concern that requires a "trust but verify" approach in every patient - starting with knowing how to use medications, especially inhalers, correctly, keeping up with refills and understanding the fundamental difference between maintenance and prns vs those medications only taken for a very short course and then stopped and not refilled.  - The proper method of use, as well as anticipated side effects, of a metered-dose inhaler are discussed and demonstrated to the patient. Improved effectiveness after extensive coaching during this visit to a level of approximately  90% so try respimat spiriva   ? Allergy > neg screen today / empirical  Prednisone 10 mg take  4 each am x 2 days,   2 each am x 2 days,  1 each am x 2 days and stop   ? Anxiety > probably related to copd/ air trapping and not the primary issue here  ? chf Lab Results  Component Value Date   PROBNP 17.0 05/14/2014  So very unlikely    I  had an extended discussion with the patient reviewing all relevant studies completed to date and  lasting 15 to 20 minutes of a 25 minute visit    Each maintenance medication was reviewed in detail including most importantly the difference between maintenance and prns and under what circumstances the prns are to be triggered using an action plan format that is not reflected in the computer generated alphabetically organized AVS.    Please see instructions for details which were reviewed in writing and the patient given a copy highlighting the part that I personally wrote and discussed at today's ov.

## 2014-09-03 ENCOUNTER — Other Ambulatory Visit: Payer: Self-pay | Admitting: Internal Medicine

## 2014-10-02 ENCOUNTER — Encounter: Payer: Self-pay | Admitting: Internal Medicine

## 2014-10-02 ENCOUNTER — Ambulatory Visit (INDEPENDENT_AMBULATORY_CARE_PROVIDER_SITE_OTHER): Payer: Medicare Other | Admitting: Internal Medicine

## 2014-10-02 VITALS — BP 150/90 | HR 96 | Ht 62.0 in | Wt 143.6 lb

## 2014-10-02 DIAGNOSIS — J449 Chronic obstructive pulmonary disease, unspecified: Secondary | ICD-10-CM

## 2014-10-02 DIAGNOSIS — I10 Essential (primary) hypertension: Secondary | ICD-10-CM | POA: Diagnosis not present

## 2014-10-02 DIAGNOSIS — J9612 Chronic respiratory failure with hypercapnia: Secondary | ICD-10-CM | POA: Diagnosis not present

## 2014-10-02 LAB — PULMONARY FUNCTION TEST
FEF 25-75 PRE: 0.25 L/s
FEF 25-75 Post: 0.26 L/sec
FEF2575-%CHANGE-POST: 1 %
FEF2575-%Pred-Post: 14 %
FEF2575-%Pred-Pre: 14 %
FEV1-%Change-Post: -2 %
FEV1-%PRED-POST: 31 %
FEV1-%Pred-Pre: 32 %
FEV1-PRE: 0.58 L
FEV1-Post: 0.57 L
FEV1FVC-%CHANGE-POST: -5 %
FEV1FVC-%Pred-Pre: 54 %
FEV6-%CHANGE-POST: 0 %
FEV6-%PRED-POST: 61 %
FEV6-%PRED-PRE: 60 %
FEV6-PRE: 1.35 L
FEV6-Post: 1.36 L
FEV6FVC-%CHANGE-POST: -2 %
FEV6FVC-%Pred-Post: 100 %
FEV6FVC-%Pred-Pre: 102 %
FVC-%CHANGE-POST: 3 %
FVC-%Pred-Post: 61 %
FVC-%Pred-Pre: 59 %
FVC-Post: 1.42 L
FVC-Pre: 1.38 L
POST FEV6/FVC RATIO: 96 %
Post FEV1/FVC ratio: 40 %
Pre FEV1/FVC ratio: 42 %
Pre FEV6/FVC Ratio: 98 %

## 2014-10-02 MED ORDER — TIOTROPIUM BROMIDE-OLODATEROL 2.5-2.5 MCG/ACT IN AERS: 2.0000 | INHALATION_SPRAY | Freq: Every day | RESPIRATORY_TRACT | Status: AC

## 2014-10-02 NOTE — Progress Notes (Signed)
PFT performed today. Pt could not perform DLCO or Lung Volumes. Per MW just do B&A.

## 2014-10-02 NOTE — Progress Notes (Signed)
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   History of Present Illness  February 20, 2009 cc sob x 1.5 years to point where sometimes can't get across a large room other times can do mall walking and house work with no discernable pattern. No nocturnal or early am cough or congestion but occasionally develops harsh upper airway cough with deep insp  rec symbicort 160      06/18/2010 ov / [name] cc doe x heavy exertion only. No cough.  Sleeping ok without nocturnal  or early am exac of resp c/o's or need for noct saba.  varible heartburn, throat congestion on ACE x years. Rec Work on inhaler technique:  Stop lisinopril and try diovan 160/12.5 mg in its place for a 6 week trial you may notice less throat irritation, less heartburn, better breathing and less need for rescue inhalters.    09/20/2012 f/u ov/[name]  Chief Complaint  Patient presents with  . Follow-up    pt reports overall breathing is doing well-- having some SOB this morning since walkin into building-- denies any other concerns at this itme  Rare need for saba ventolin while on symbicort 160 2bid - only problems walking outside in humid weather or inclines, does fine flat surfaces indoors. rec Continue symbicort 160 Take 2 puffs first thing in am and then another 2 puffs about 12 hours later.  Only use your albuterol as a rescue medication   02/15/2013 f/u ov/[name] re:  GOLD III COPD Chief Complaint  Patient presents with  . COPD    Increased sob x2-3 days.  SATS 82% upon arival  Uses ventolin sev times per day on avg and no worse lately  No noct need for saba  rec tudorza one twice daily in addition to your symbicort and should see improvement in breathing and  less need for ventolin    08/16/2014 f/u ov/[name] re: GOLD III copd/ worse sob x weeks/ did not increase gerd rx as rec/ maint on symb 160 and tudorza  Chief Complaint  Patient presents with  . Follow-up    Pt states her breathing is unchanged. She is using her albuterol inhaler onn average 3 x per day and had not used neb.   doe x more than slow adls/ MMRC 2  Only uses albterol when overdoes/ no noct events Increased already the prilosec 40 Take 30- 60 min before your first and last meals of the day  rec Stop tudorza and try spiriva respimat 2 puffs each am Work on inhaler technique:    Prednisone 10 mg take  4 each am x 2 days,   2 each am x 2 days,  1 each am x 2 days and stop   10/02/2014 f/u ov/[name] re: GOLD III  On symb/ tudorza and freq saba  Chief Complaint  Patient presents with  . Follow-up    PFT done today. Pt states that her breathing has been progressively worse since last visit. She is using ventolin 3-4 x per day and only uses neb 1 x per month on average. She d/c'ed spiriva approx 5 wks ago and [place].   no problem sitting still or sleeping just walking  if do more than aisle at  or nl pace = MMRC 2 - no better p pred last ov/ no better on spiriva respimat    No obvious daytime variabilty or assoc cough chronic cp or chest tightness, subjective wheeze overt sinus or hb symptoms. No unusual exp hx or h/o childhood pna/ asthma or knowledge of premature birth.   Sleeping ok without nocturnal  or early am exacerbation  of respiratory  c/o's or need for noct saba. Also denies any obvious fluctuation of symptoms with weather or environmental changes or other aggravating or alleviating factors except as outlined above   ROS  The following are not active complaints unless bolded sore throat, dysphagia, dental problems, itching, sneezing,  nasal congestion or excess/ purulent secretions, ear ache,   fever, chills, sweats, unintended wt loss, pleuritic or exertional cp,  hemoptysis,  orthopnea pnd or leg swelling, presyncope, palpitations, heartburn, abdominal pain, anorexia, nausea, vomiting, diarrhea  or change in bowel or urinary habits, change in stools or urine, dysuria,hematuria,  rash, arthralgias, visual complaints, headache, numbness weakness or ataxia or problems with walking or coordination,  change in mood/affect or memory.        Past Medical History:  HYPERTENSION (ICD-401.9)  COPD (ICD-496)  - PFT's February 20, 2009 FEV .62 (32%) ratio 38  - HFA 75% 09/14/2011  - PFT's March 27, 2009 FEV1 .85 (40%) 44% and no better adfter B2 and DLC0 39%  ? childhood asthma            Objective:   Physical Exam    very pleasant amb bf nad   wt 160 March 27, 2009 > 161 Jun 24, 2009 > 139 06/18/2010 > 09/14/2011  146> 09/20/2012  142> 02/15/2013 139 > 05/27/2013  135 > 12/19/2013 139 > 05/14/14  138 > 05/28/2014  137>   08/16/2014  142 > 10/02/2014 144    HEENT mild turbinate edema. Oropharynx no thrush or excess pnd or cobblestoning. No JVD or cervical adenopathy. Mild accessory muscle hypertrophy. Trachea midline, nl thryroid. Chest was hyperinflated by percussion with diminished breath sounds and moderate increased exp time with trace mid exp bilateral  Wheeze.  Hoover sign positive at mid inspiration. Regular rate and rhythm without murmur gallop or rub or increase P2 or edema. Abd: no hsm, nl excursion. Ext warm without cyanosis or clubbing.        CXR PA and Lateral:   08/16/2014 :     I personally reviewed images and agree with radiology impression as follows:   COPD. There is no active cardiopulmonary disease.     labs  reviewed Allergy profile  08/16/14 >  IgE 67 neg RAST,  Eos 0.1  Lab Results  Component Value Date   TSH 0.96 05/14/2014      Assessment & Plan:

## 2014-10-02 NOTE — Patient Instructions (Signed)
Try off symbicort and tudorza and just take stiolto 2 pffs each am  Please see patient coordinator before you leave today  to schedule pulmonary rehab  In meantime walk at half the pace you did for [place] today  Please schedule a follow up visit in 3 months but call sooner if needed

## 2014-10-02 NOTE — Assessment & Plan Note (Addendum)
-    02/15/13 sats after walking 50 ft = 82% > offered amb 02 but declined -  05/26/2013   Walked RA x one lap @ 185 stopped due to desat to 86% but declined 02  -  HC03  34 05/14/14  -  08/16/2014  Walked RA x 1 laps @ 185 ft each stopped due to desat to 86% at nl pace/ no sob/ decline 02   - 10/02/2014   Walked RA x one lap @ 185 stopped due to  Sob nl pace/ sob but no desat  Adequate control on present rx, reviewed > no change in rx needed  No role for 02 at this point

## 2014-10-02 NOTE — Assessment & Plan Note (Addendum)
Quit smoking 2003  - daughter is [name], md [place] - PFT's February 20, 2009 FEV .62 (32%) ratio 38    GOLD III   PFT's March 27, 2009 FEV1 .85 (40%) 44% and no better after B2 and DLC0 39%  - Alpha one Antitrypsin 09/14/2011 >>  MM - 02/15/2013   added tudorza and improved  . 05/14/2014 p extensive coaching HFA effectiveness =    75% -Allergy profile .08/16/14 >  IgE 67 neg RAST,  Eos 0.1 - changed tudorza to spiriva respimat 08/16/14 > no better  - PFT's  10/02/2014  FEV1 57 (31 % ) ratio 40  p no % improvement from saba   Really not much changed but way over using saba   I had an extended discussion with the patient reviewing all relevant studies completed to date and  lasting 15 to 20 minutes of a 25 minute visit    1) pfts reviewed  2) concept of proper use of saba reviewed  3) Formulary restrictions will be an ongoing challenge for the forseable future and I would be happy to pick an alternative if the pt will first  provide me a list of them but pt  will need to return here for training for any new device that is required eg dpi vs hfa vs respimat.    In meantime we can always provide samples so the patient never runs out of any needed respiratory medications.   4) since tudorza not covered rec trial of stioloto   5) Discussed in detail all the  indications, usual  risks and alternatives  relative to the benefits with patient who agrees to proceed with  Rehab   Each maintenance medication was reviewed in detail including most importantly the difference between maintenance and prns and under what circumstances the prns are to be triggered using an action plan format that is not reflected in the computer generated alphabetically organized AVS.    Please see instructions for details which were reviewed in writing and the patient given a copy highlighting the part that I personally wrote and discussed at today's ov.

## 2014-10-02 NOTE — Assessment & Plan Note (Signed)
?   ACE cough 06/18/2010  >   changed to ARB > resolved   Not ideally controlled but last bp done p pfts > rec avoid na and Follow up per Primary Care planned

## 2014-10-11 ENCOUNTER — Telehealth: Payer: Self-pay | Admitting: Internal Medicine

## 2014-10-11 NOTE — Telephone Encounter (Signed)
Spoke with pharmacist  He is faxing over PA form  Will await fax

## 2014-10-12 NOTE — Telephone Encounter (Signed)
Have you received this fax?

## 2014-10-12 NOTE — Telephone Encounter (Signed)
Checked PA box, it is not in there. Called pharmacy to have pa refaxed.  Will await fax.

## 2014-10-12 NOTE — Telephone Encounter (Signed)
No

## 2014-10-16 NOTE — Telephone Encounter (Signed)
Verlon Au, have you received the PA form from pharmacy?  Please advise. Thanks

## 2014-10-17 ENCOUNTER — Telehealth: Payer: Self-pay | Admitting: *Deleted

## 2014-10-17 NOTE — Telephone Encounter (Signed)
See phone note dated 8/7 PA initiated through cover my meds

## 2014-10-17 NOTE — Telephone Encounter (Signed)
Called and spoke to [employer] at [phone]. PA for Stiolto is needed a the PA form is being faxed to front fax. Will await form to complete and then fax back.  Will hold in triage till form has been received.

## 2014-10-17 NOTE — Telephone Encounter (Signed)
Initiated PA thru Cover My Meds  Key: [id number] Will await response.

## 2014-10-18 ENCOUNTER — Telehealth: Payer: Self-pay | Admitting: Internal Medicine

## 2014-10-18 NOTE — Telephone Encounter (Signed)
Patient calling to confirm that she is not suppose to be taking Symbicort  Confirmed based on Dr. Thurston Hole last OV 10/02/14  Copy of last OV faxed to [name], MD - [phone] Fax: [phone] per patient's request.  Nothing further needed.

## 2014-10-22 ENCOUNTER — Telehealth (HOSPITAL_COMMUNITY): Payer: Self-pay

## 2014-10-22 NOTE — Telephone Encounter (Signed)
Stiolto approved per Cover my meds. Pharmacy notified.

## 2014-10-22 NOTE — Telephone Encounter (Signed)
I have called and left a message with Kendelle to inquire about participation in Pulmonary Rehab per Dr. Thurston Hole referral. Will send letter in mail and follow up.

## 2014-10-26 ENCOUNTER — Telehealth (HOSPITAL_COMMUNITY): Payer: Self-pay

## 2014-10-26 NOTE — Telephone Encounter (Signed)
Called patient in regards to Pulmonary Rehab.  Patient states that she is interested but lives closer to [facility].  I have sent all patient information to the [facility]. Encouraged patient to contact [facility] if she has not heard from them in 1-2 weeks.

## 2014-11-06 ENCOUNTER — Telehealth: Payer: Self-pay | Admitting: Internal Medicine

## 2014-11-06 ENCOUNTER — Other Ambulatory Visit: Payer: Self-pay | Admitting: Internal Medicine

## 2014-11-06 NOTE — Telephone Encounter (Signed)
Called pt and received VM--lmtcb x1

## 2014-11-07 NOTE — Telephone Encounter (Signed)
Called spoke with pt. Made her aware that according to her previous refills only 1 inhaler was sent in as well. She reports she did pick up 1 inhaler but thought she normally gets 2. Pt is aware she does have refills on the ventolin. Nothing further needed

## 2014-12-20 ENCOUNTER — Telehealth: Payer: Self-pay | Admitting: Internal Medicine

## 2014-12-20 NOTE — Telephone Encounter (Signed)
Pt calling for pulm rehab # in [place] I do not have this. Please advise PCC's thanks

## 2014-12-21 NOTE — Telephone Encounter (Signed)
Called phone # and got a recording that is does not except our calls [name]

## 2014-12-21 NOTE — Telephone Encounter (Signed)
Unable to reach pt shopuld she call back the only# we found was [facility] in [place] [phone] Tobe SosSally E Ottinger

## 2014-12-24 ENCOUNTER — Telehealth: Payer: Self-pay | Admitting: Internal Medicine

## 2014-12-24 NOTE — Telephone Encounter (Signed)
[place]  They need a pre-post Spirometry faxed to them.   [facility] - Attn: Haywood LassoLynette, Fax: [phone].   Patient notified. Nothing further needed. Closing encounter

## 2014-12-24 NOTE — Telephone Encounter (Signed)
Spoke with pt. She is aware of the phone number. Nothing further was needed.

## 2015-01-08 ENCOUNTER — Ambulatory Visit (INDEPENDENT_AMBULATORY_CARE_PROVIDER_SITE_OTHER): Payer: Medicare Other | Admitting: Internal Medicine

## 2015-01-08 ENCOUNTER — Encounter: Payer: Self-pay | Admitting: Internal Medicine

## 2015-01-08 VITALS — BP 158/96 | HR 109 | Ht 62.0 in | Wt 143.8 lb

## 2015-01-08 DIAGNOSIS — I10 Essential (primary) hypertension: Secondary | ICD-10-CM

## 2015-01-08 DIAGNOSIS — J449 Chronic obstructive pulmonary disease, unspecified: Secondary | ICD-10-CM

## 2015-01-08 DIAGNOSIS — J9612 Chronic respiratory failure with hypercapnia: Secondary | ICD-10-CM | POA: Diagnosis not present

## 2015-01-08 MED ORDER — PREDNISONE 10 MG PO TABS: ORAL_TABLET | ORAL | Status: DC

## 2015-01-08 MED ORDER — VALSARTAN 320 MG PO TABS: 320.0000 mg | ORAL_TABLET | Freq: Every day | ORAL | Status: AC

## 2015-01-08 NOTE — Assessment & Plan Note (Signed)
-    02/15/13 sats after walking 50 ft = 82% > offered amb 02 but declined -  05/26/2013   Walked RA x one lap @ 185 stopped due to desat to 86% but declined 02  -  HC03  34 05/14/14  -  08/16/2014  Walked RA x 1 laps @ 185 ft each stopped due to desat to 86% at nl pace/ no sob/ decline 02   - 10/02/2014   Walked RA x one lap @ 185 stopped due to  Sob nl pace/ sob but no desat  Referred to rehab > to start Dec 7

## 2015-01-08 NOTE — Progress Notes (Signed)
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   History of Present Illness  February 20, 2009 cc sob x 1.5 years to point where sometimes can't get across a large room other times can do mall walking and house work with no discernable pattern. No nocturnal or early am cough or congestion but occasionally develops harsh upper airway cough with deep insp  rec symbicort 160      06/18/2010 ov / [name] cc doe x heavy exertion only. No cough.  Sleeping ok without nocturnal  or early am exac of resp c/o's or need for noct saba.  varible heartburn, throat congestion on ACE x years. Rec Work on inhaler technique:  Stop lisinopril and try diovan 160/12.5 mg in its place for a 6 week trial you may notice less throat irritation, less heartburn, better breathing and less need for rescue inhalters.    09/20/2012 f/u ov/[name]  Chief Complaint  Patient presents with  . Follow-up    pt reports overall breathing is doing well-- having some SOB this morning since walkin into building-- denies any other concerns at this itme  Rare need for saba ventolin while on symbicort 160 2bid - only problems walking outside in humid weather or inclines, does fine flat surfaces indoors. rec Continue symbicort 160 Take 2 puffs first thing in am and then another 2 puffs about 12 hours later.  Only use your albuterol as a rescue medication   02/15/2013 f/u ov/[name] re:  GOLD III COPD Chief Complaint  Patient presents with  . COPD    Increased sob x2-3 days.  SATS 82% upon arival  Uses ventolin sev times per day on avg and no worse lately  No noct need for saba  rec tudorza one twice daily in addition to your symbicort and should see improvement in breathing and  less need for ventolin    08/16/2014 f/u ov/[name] re: GOLD III copd/ worse sob x weeks/ did not increase gerd rx as rec/ maint on symb 160 and tudorza  Chief Complaint  Patient presents with  . Follow-up    Pt states her breathing is unchanged. She is using her albuterol inhaler onn average 3 x per day and had not used neb.   doe x more than slow adls/ MMRC 2  Only uses albterol when overdoes/ no noct events Increased already the prilosec 40 Take 30- 60 min before your first and last meals of the day  rec Stop tudorza and try spiriva respimat 2 puffs each am Work on inhaler technique:    Prednisone 10 mg take  4 each am x 2 days,   2 each am x 2 days,  1 each am x 2 days and stop   10/02/2014 f/u ov/[name] re: GOLD III  On symb/ tudorza and freq saba  Chief Complaint  Patient presents with  . Follow-up    PFT done today. Pt states that her breathing has been progressively worse since last visit. She is using ventolin 3-4 x per day and only uses neb 1 x per month on average. She d/c'ed spiriva approx 5 wks ago and [place].   no problem sitting still or sleeping just walking  if do more than aisle at  or nl pace = MMRC 2 - no better p pred last ov/ no better on spiriva respimat  rec Try off symbicort and tudorza and just take stiolto 2 pffs each am Please see patient coordinator before you leave today  to schedule pulmonary rehab In meantime walk at half the pace you did for [place] today    01/08/2015  f/u ov/[name] re: GOLD III copd/ on stiolto  Chief Complaint  Patient presents with  . Follow-up    Pt states her breathing has been worse for the past few days. She is using rescue inhaler 4 x per day on average and had to use neb recently for the first time in a while.   since started stiolto avg saba hfa twice daily then started worse 11/27 Sleeping fine  Problem is with walking and pushed herself really hard over tgiving     No obvious daytime variabilty or assoc cough or  cp  or chest tightness, subjective wheeze overt sinus or hb symptoms. No unusual exp hx or h/o childhood pna/ asthma or knowledge of premature birth.   Sleeping ok without nocturnal  or early am exacerbation  of respiratory  c/o's or need for noct saba. Also denies any obvious fluctuation of symptoms with weather or environmental changes or other aggravating or alleviating factors except as outlined above   ROS  The following are not active complaints unless bolded sore throat, dysphagia, dental problems, itching, sneezing,  nasal congestion or excess/ purulent secretions, ear ache,   fever, chills, sweats, unintended wt loss, pleuritic or exertional cp, hemoptysis,  orthopnea pnd or leg swelling, presyncope, palpitations, heartburn, abdominal pain, anorexia, nausea, vomiting, diarrhea  or change in bowel or urinary habits, change in stools or urine, dysuria,hematuria,  rash, arthralgias, visual complaints, headache, numbness weakness or ataxia or problems with walking or coordination,  change in mood/affect or memory.        Past Medical History:  HYPERTENSION (ICD-401.9)  COPD (ICD-496)  - PFT's February 20, 2009 FEV .62 (32%) ratio 38  - HFA 75% 09/14/2011  - PFT's March 27, 2009 FEV1 .85 (40%) 44% and no better adfter B2 and DLC0 39%  ? childhood asthma            Objective:   Physical Exam    very pleasant amb bf nad   wt 160 March 27, 2009 > 161 Jun 24, 2009 > 139 06/18/2010 > 09/14/2011  146> 09/20/2012  142> 02/15/2013 139 > 05/27/2013  135 > 12/19/2013 139 > 05/14/14  138 > 05/28/2014  137>   08/16/2014  142 > 10/02/2014 144 > 01/08/2015  144   Vital signs reviewed, elevated bp noted   HEENT mild turbinate edema. Oropharynx no thrush or excess pnd or cobblestoning. No JVD or cervical adenopathy. Mild accessory muscle hypertrophy. Trachea midline, nl thryroid. Chest was hyperinflated by percussion with diminished breath sounds and moderate increased exp time with trace mid exp bilateral   Wheeze.  Hoover sign positive at mid inspiration. Regular rate and rhythm without murmur gallop or rub or increase P2 or edema. Abd: no hsm, nl excursion. Ext warm without cyanosis or clubbing.        CXR PA and Lateral:   08/16/2014 :     I personally reviewed images and agree with radiology impression as follows:   COPD. There is no active cardiopulmonary disease.          Assessment & Plan:

## 2015-01-08 NOTE — Assessment & Plan Note (Signed)
?   ACE cough 06/18/2010  >   changed to ARB > resolved but not adequately controlled on diova 160 plus hctz 12.5 so rec double the diovan to 320 mg daily

## 2015-01-08 NOTE — Patient Instructions (Addendum)
Prednisone 10 mg take  4 each am x 2 days,   2 each am x 2 days,  1 each am x 2 days and stop   Continue stiolto 2 pffs first thing in am  Only use your albuterol as a rescue medication to be used if you can't catch your breath by resting or doing a relaxed purse lip breathing pattern.  - The less you use it, the better it will work when you need it. - Ok to use up to 2 puffs  every 4 hours if you must but call for immediate appointment if use goes up over your usual need - Don't leave home without it !!  (think of it like the spare tire for your car)  Only use the nebulizer if you try the albuterol  inhaler first and it doesn't work   Increase the diovan to 320 mg daily   Please schedule a follow up office visit in 6 weeks, call sooner if needed

## 2015-01-08 NOTE — Assessment & Plan Note (Addendum)
Quit smoking 2003  - daughter is Edwena FeltyAnelia Boney, md [place] - PFT's February 20, 2009 FEV .62 (32%) ratio 38    GOLD III   PFT's March 27, 2009 FEV1 .85 (40%) 44% and no better after B2 and DLC0 39%  - Alpha one Antitrypsin 09/14/2011 >>  MM - 02/15/2013   added tudorza and improved  . 05/14/2014 p extensive coaching HFA effectiveness =    75% -Allergy profile .08/16/14 >  IgE 67 neg RAST,  Eos 0.1 - changed tudorza to spiriva respimat 08/16/14 > no better  - PFT's  10/02/2014  FEV1 57 (31 % ) ratio 40  p no % improvement from saba  - try stiolto 2 puffs qam 10/02/2014 and referred to rehab    Having mild flare with active wheeze and if this pattern continues may need to consider change back to spiriva/ symbicort or spiriva /laba/ics dpi  In meantime rx with Prednisone 10 mg take  4 each am x 2 days,   2 each am x 2 days,  1 each am x 2 days and stop   01/08/2015  extensive coaching HFA effectiveness =    75% (Ti a bit short)   I had an extended discussion with the patient reviewing all relevant studies completed to date and  lasting 15 to 20 minutes of a 25 minute visit    Each maintenance medication was reviewed in detail including most importantly the difference between maintenance and prns and under what circumstances the prns are to be triggered using an action plan format that is not reflected in the computer generated alphabetically organized AVS.    Please see instructions for details which were reviewed in writing and the patient given a copy highlighting the part that I personally wrote and discussed at today's ov.

## 2015-01-15 ENCOUNTER — Other Ambulatory Visit: Payer: Self-pay | Admitting: Internal Medicine

## 2015-01-30 ENCOUNTER — Telehealth: Payer: Self-pay | Admitting: Internal Medicine

## 2015-01-30 DIAGNOSIS — J9612 Chronic respiratory failure with hypercapnia: Secondary | ICD-10-CM

## 2015-01-30 NOTE — Telephone Encounter (Signed)
Spoke with the pt and she states to go ahead and order ambulatory o2  [facility]  Will forward to MW to make him aware so that he can update the overview accordingly

## 2015-01-30 NOTE — Telephone Encounter (Signed)
MW has reviewed [facility]MW results from [facility] (she is doing rehab there) Results show that pt desats with exertion but maintains sats above 90% on 4lpm o2  Per MW- okay to have 4lpm for rehab purposes, and we can order for her to have at home if she wants She has declined o2 in the past  I spoke with Lucrezia StarchSusan Martin [facility] and notified her of recs per MW  She verbalized understanding, and states she feels pt would be accepting of o2 at home at this point  I have call the pt to confirm this with her before we order, and [facility]

## 2015-01-30 NOTE — Telephone Encounter (Signed)
Done  rec titrate to > 90% with ex only, no need for 02 o/w

## 2015-01-30 NOTE — Telephone Encounter (Signed)
Call before 10 if not call after 1  Phone# [phone]

## 2015-01-30 NOTE — Telephone Encounter (Signed)
Pt getting ready to leave hm for another appointment and can be reached @ [phone].Caren GriffinsStanley A Dalton

## 2015-02-05 ENCOUNTER — Telehealth: Payer: Self-pay | Admitting: Internal Medicine

## 2015-02-05 DIAGNOSIS — J449 Chronic obstructive pulmonary disease, unspecified: Secondary | ICD-10-CM

## 2015-02-05 NOTE — Telephone Encounter (Signed)
Called spoke with pt. She was delivered o2 last Wednesday. She wants an order for POC. I have placed order. Nothing further needed

## 2015-02-19 ENCOUNTER — Ambulatory Visit: Payer: Medicare Other | Admitting: Internal Medicine

## 2015-02-19 ENCOUNTER — Ambulatory Visit (INDEPENDENT_AMBULATORY_CARE_PROVIDER_SITE_OTHER): Payer: Medicare Other | Admitting: Internal Medicine

## 2015-02-19 ENCOUNTER — Encounter: Payer: Self-pay | Admitting: Internal Medicine

## 2015-02-19 VITALS — BP 142/80 | HR 100 | Ht 62.5 in | Wt 146.0 lb

## 2015-02-19 DIAGNOSIS — J9611 Chronic respiratory failure with hypoxia: Secondary | ICD-10-CM

## 2015-02-19 DIAGNOSIS — J449 Chronic obstructive pulmonary disease, unspecified: Secondary | ICD-10-CM | POA: Diagnosis not present

## 2015-02-19 DIAGNOSIS — J9612 Chronic respiratory failure with hypercapnia: Secondary | ICD-10-CM

## 2015-02-19 NOTE — Progress Notes (Signed)
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   History of Present Illness  February 20, 2009 cc sob x 1.5 years to point where sometimes can't get across a large room other times can do mall walking and house work with no discernable pattern. No nocturnal or early am cough or congestion but occasionally develops harsh upper airway cough with deep insp  rec symbicort 160      06/18/2010 ov / [name] cc doe x heavy exertion only. No cough.  Sleeping ok without nocturnal  or early am exac of resp c/o's or need for noct saba.  varible heartburn, throat congestion on ACE x years. Rec Work on inhaler technique:  Stop lisinopril and try diovan 160/12.5 mg in its place for a 6 week trial you may notice less throat irritation, less heartburn, better breathing and less need for rescue inhalters.    09/20/2012 f/u ov/[name]  Chief Complaint  Patient presents with  . Follow-up    pt reports overall breathing is doing well-- having some SOB this morning since walkin into building-- denies any other concerns at this itme  Rare need for saba ventolin while on symbicort 160 2bid - only problems walking outside in humid weather or inclines, does fine flat surfaces indoors. rec Continue symbicort 160 Take 2 puffs first thing in am and then another 2 puffs about 12 hours later.  Only use your albuterol as a rescue medication   02/15/2013 f/u ov/[name] re:  GOLD III COPD Chief Complaint  Patient presents with  . COPD    Increased sob x2-3 days.  SATS 82% upon arival  Uses ventolin sev times per day on avg and no worse lately  No noct need for saba  rec tudorza one twice daily in addition to your symbicort and should see improvement in breathing and  less need for ventolin    08/16/2014 f/u ov/[name] re: GOLD III copd/ worse sob x weeks/ did not increase gerd rx as rec/ maint on symb 160 and tudorza  Chief Complaint  Patient presents with  . Follow-up    Pt states her breathing is unchanged. She is using her albuterol inhaler onn average 3 x per day and had not used neb.   doe x more than slow adls/ MMRC 2  Only uses albterol when overdoes/ no noct events Increased already the prilosec 40 Take 30- 60 min before your first and last meals of the day  rec Stop tudorza and try spiriva respimat 2 puffs each am Work on inhaler technique:    Prednisone 10 mg take  4 each am x 2 days,   2 each am x 2 days,  1 each am x 2 days and stop   10/02/2014 f/u ov/[name] re: GOLD III  On symb/ tudorza and freq saba  Chief Complaint  Patient presents with  . Follow-up    PFT done today. Pt states that her breathing has been progressively worse since last visit. She is using ventolin 3-4 x per day and only uses neb 1 x per month on average. She d/c'ed spiriva approx 5 wks ago [place]   no problem sitting still or sleeping just walking  if do more than aisle at  or nl pace = MMRC 2 - no better p pred last ov/ no better on spiriva respimat  rec Try off symbicort and tudorza and just take stiolto 2 pffs each am Please see patient coordinator before you leave today  to schedule pulmonary rehab In meantime walk at half the pace you did for us today    01/08/2015  f/u ov/[name] re: GOLD III copd/ on stiolto  Chief Complaint  Patient presents with  . Follow-up    Pt states her breathing has been worse for the past few days. She is using rescue inhaler 4 x per day on average and had to use neb recently for the first time in a while.   since started stiolto avg saba hfa twice daily then started worse 11/27 Sleeping fine  Problem is with walking and pushed herself really hard over tgiving  rec Prednisone 10 mg take  4 each am x 2 days,   2 each  am x 2 days,  1 each am x 2 days and stop  Continue stiolto 2 pffs first thing in am Only use your albuterol as a rescue medication  Only use the nebulizer if you try the albuterol  inhaler first and it doesn't work  Increase the diovan to 320 mg daily    02/19/2015  f/u ov/[name] re: GOLD IV COPD/ maint on stiolto but prefers symb/ tudorza  Chief Complaint  Patient presents with  . Follow-up    Pt c/o increased SOB x 2 wks. She is using ventolin once daily on average and has not used neb.     doe now room to room = MMRC3 = can't walk 100 yards even at a slow pace at a flat grade s stopping due to sob  / pred rx= no better   No obvious daytime variabilty or assoc excess/ purulent sputum or mucus plugs  or  cp or chest tightness, subjective wheeze overt sinus or hb symptoms. No unusual exp hx or h/o childhood pna/ asthma or knowledge of premature birth.   Sleeping ok without nocturnal  or early am exacerbation  of respiratory  c/o's or need for noct saba. Also denies any obvious fluctuation of symptoms with weather or environmental changes or other aggravating or alleviating factors except as outlined above   ROS  The following are not active complaints unless bolded sore throat, dysphagia, dental problems, itching, sneezing,  nasal congestion or excess/ purulent secretions, ear ache,   fever, chills, sweats, unintended wt loss, pleuritic or exertional cp, hemoptysis,  orthopnea pnd or leg swelling, presyncope, palpitations, heartburn, abdominal pain, anorexia, nausea, vomiting, diarrhea  or change in bowel or urinary habits, change in stools or urine, dysuria,hematuria,  rash, arthralgias, visual complaints, headache, numbness weakness or ataxia or problems with walking or coordination,  change in mood/affect or memory.        Past Medical History:  HYPERTENSION (ICD-401.9)  COPD (ICD-496)  - PFT's February 20, 2009 FEV .62 (32%) ratio 38  - HFA 75% 09/14/2011  - PFT's March 27, 2009 FEV1  .85 (40%) 44% and no better after B2 and DLC0 39%  ? childhood asthma            Objective:   Physical Exam     pleasant  Chronically ill appearing bf nad  / in w/c for first time today  wt 160 March 27, 2009 > 161 Jun 24, 2009 > 139 06/18/2010 > 09/14/2011  146>  09/20/2012  142> 02/15/2013 139 > 05/27/2013  135 > 12/19/2013 139 > 05/14/14  138 > 05/28/2014  137>   08/16/2014  142 > 10/02/2014 144 > 01/08/2015  144 > 02/19/2015  146  Vital signs reviewed   HEENT mild turbinate edema. Oropharynx no thrush or excess pnd or cobblestoning. No JVD or cervical adenopathy. Mild accessory muscle hypertrophy. Trachea midline, nl thryroid. Chest was hyperinflated by percussion with diminished breath sounds and moderate increased exp time with trace mid exp bilateral  Wheeze.  Hoover sign positive at mid inspiration. Regular rate and rhythm without murmur gallop or rub or increase P2 or edema. Abd: no hsm, nl excursion. Ext warm without cyanosis or clubbing.        CXR PA and Lateral:   08/16/2014 :     I personally reviewed images and agree with radiology impression as follows:   COPD. There is no active cardiopulmonary disease.          Assessment & Plan:

## 2015-02-19 NOTE — Patient Instructions (Addendum)
Please see patient coordinator before you leave today  to schedule ono Room air  > continue to wear 02 with exertion adjusted to saturations over 90%   Ok to substitute symbicort 160/tudorza twice daily in place of stiolto if you like it better and have access to samples  Work on inhaler technique:  relax and gently blow all the way out then take a nice smooth deep breath back in, triggering the inhaler at same time you start breathing in.  Hold for up to 5 seconds if you can. Blow out thru nose. Rinse and gargle with water when done     Please schedule a follow up visit in 3 months but call sooner if needed

## 2015-02-20 NOTE — Assessment & Plan Note (Signed)
-    02/15/13 sats after walking 50 ft = 82% > offered amb 02 but declined -  05/26/2013   Walked RA x one lap @ 185 stopped due to desat to 86% but declined 02  -  HC03  34 05/14/14  -  08/16/2014  Walked RA x 1 laps @ 185 ft each stopped due to desat to 86% at nl pace/ no sob/ decline 02   - 10/02/2014   Walked RA x one lap @ 185 stopped due to  Sob nl pace/ sob but no desat - 01/30/2015 reported to be desating with ex in rehab > rec titrate to > 90% with ex only, no need for 02 o/w - ono RA 02/19/2015 >>>   rx as of 02/19/2015 = no need at rest or sleeping pending ono ra/ titrate to 90% with activity

## 2015-02-20 NOTE — Assessment & Plan Note (Addendum)
Quit smoking 2003  - daughter is Edwena FeltyAnelia Pyle, md [place] - PFT's February 20, 2009 FEV .62 (32%) ratio 38    GOLD III   PFT's March 27, 2009 FEV1 .85 (40%) 44% and no better after B2 and DLC0 39%  - Alpha one Antitrypsin 09/14/2011 >>  MM - 02/15/2013   added tudorza and improved  . 05/14/2014 p extensive coaching HFA effectiveness =    75% -Allergy profile .08/16/14 >  IgE 67 neg RAST,  Eos 0.1 - changed tudorza to spiriva respimat 08/16/14 > no better  - PFT's  10/02/2014  FEV1 57 (31 % ) ratio 40  p no % improvement from saba  - try stiolto 2 puffs qam 10/02/2014 and referred to rehab    Prefers symbiocrt/ tudorza so rehcallenged with samples today  - The proper method of use, as well as anticipated side effects, of a metered-dose inhaler are discussed and demonstrated to the patient. Improved effectiveness after extensive coaching during this visit to a level of approximately 75 % from a baseline of 50 %  But 100% with dpi   May need to consider complete transition to dpi or other option = laba/ics per neb per Part B medicare if her Part D won't pay for the meds she feels work the best as symbicort is avail in neb form and her hfa isn't all that great anyway  I had an extended discussion with the patient reviewing all relevant studies completed to date and  lasting 15 to 20 minutes of a 25 minute visit    Each maintenance medication was reviewed in detail including most importantly the difference between maintenance and prns and under what circumstances the prns are to be triggered using an action plan format that is not reflected in the computer generated alphabetically organized AVS.    Please see instructions for details which were reviewed in writing and the patient given a copy highlighting the part that I personally wrote and discussed at today's ov.

## 2015-03-05 ENCOUNTER — Telehealth: Payer: Self-pay | Admitting: Internal Medicine

## 2015-03-05 DIAGNOSIS — J9611 Chronic respiratory failure with hypoxia: Secondary | ICD-10-CM

## 2015-03-05 DIAGNOSIS — J9612 Chronic respiratory failure with hypercapnia: Principal | ICD-10-CM

## 2015-03-05 NOTE — Telephone Encounter (Signed)
Per MW- ONO on RA from [facility] on 02/28/15 was reviewed  Per MW- Pos for desat- needs to start noct o2 2lpm and repeat ONO on 2lpm  Spoke with pt and notified of results per Dr. Sherene Sires. Pt verbalized understanding and denied any questions. Order was sent to [facility]

## 2015-03-15 ENCOUNTER — Other Ambulatory Visit: Payer: Self-pay | Admitting: Internal Medicine

## 2015-03-20 ENCOUNTER — Telehealth: Payer: Self-pay | Admitting: Internal Medicine

## 2015-03-20 MED ORDER — UMECLIDINIUM BROMIDE 62.5 MCG/INH IN AEPB: 1.0000 | INHALATION_SPRAY | Freq: Every day | RESPIRATORY_TRACT | Status: AC

## 2015-03-20 NOTE — Telephone Encounter (Signed)
If the pt hasn't tried incurse that's fine, but if she has and didn't like it or it didn't work then [facility] hfa 2 qid is roughly the same - if needed just give her a couple of boxes on tudorza and make appt before then run out to regroup

## 2015-03-20 NOTE — Telephone Encounter (Signed)
Per 02/19/15 OV: Patient Instructions     Please see patient coordinator before you leave today to schedule ono Room air > continue to wear 02 with exertion adjusted to saturations over 90%  Ok to substitute symbicort 160/tudorza twice daily in place of stiolto if you like it better and have access to samples Work on inhaler technique: relax and gently blow all the way out then take a nice smooth deep breath back in, triggering the inhaler at same time you start breathing in. Hold for up to 5 seconds if you can. Blow out thru nose. Rinse and gargle with water when done  Please schedule a follow up visit in 3 months but call sooner if needed   ---  Called [facility]--spoke with Raynelle Fanning. Was advise tudorza/symbicort is not on patient formulary. The covered alternatives are incruse and atrovent HFA. Please advise Dr. Sherene Sires thanks

## 2015-03-20 NOTE — Telephone Encounter (Signed)
Called spoke with pt. She has not tried incruse. She is requesting RX be sent to [facility] as she is not able to come to the office for any samples. I have sent this in for her. Nothing further needed

## 2015-03-22 ENCOUNTER — Encounter: Payer: Self-pay | Admitting: Internal Medicine

## 2015-04-08 ENCOUNTER — Other Ambulatory Visit: Payer: Self-pay | Admitting: Internal Medicine

## 2015-04-09 ENCOUNTER — Other Ambulatory Visit: Payer: Self-pay | Admitting: Internal Medicine

## 2015-04-09 MED ORDER — ALBUTEROL SULFATE HFA 108 (90 BASE) MCG/ACT IN AERS: INHALATION_SPRAY | RESPIRATORY_TRACT | Status: AC

## 2015-05-21 ENCOUNTER — Ambulatory Visit: Payer: PRIVATE HEALTH INSURANCE | Admitting: Internal Medicine

## 2015-07-11 ENCOUNTER — Other Ambulatory Visit: Payer: Self-pay | Admitting: Internal Medicine

## 2015-11-08 ENCOUNTER — Other Ambulatory Visit: Payer: Self-pay | Admitting: Internal Medicine

## 2015-11-12 ENCOUNTER — Other Ambulatory Visit: Payer: Self-pay | Admitting: Internal Medicine

## 2015-11-19 ENCOUNTER — Other Ambulatory Visit: Payer: Self-pay | Admitting: Internal Medicine

## 2015-12-04 ENCOUNTER — Other Ambulatory Visit: Payer: Self-pay | Admitting: Internal Medicine

## 2015-12-04 DIAGNOSIS — I10 Essential (primary) hypertension: Secondary | ICD-10-CM

## 2017-05-31 IMAGING — CR DG CHEST 2V
2 series · 2 of 2 positions shown · non-contrast
Comparison: PA and lateral chest of May 23, 2013

CLINICAL DATA: Three weeks of shortness of breath, history of COPD,
childhood asthma, and previous tobacco use

EXAM:
CHEST  2 VIEW

[view not recorded (1 of 2)]
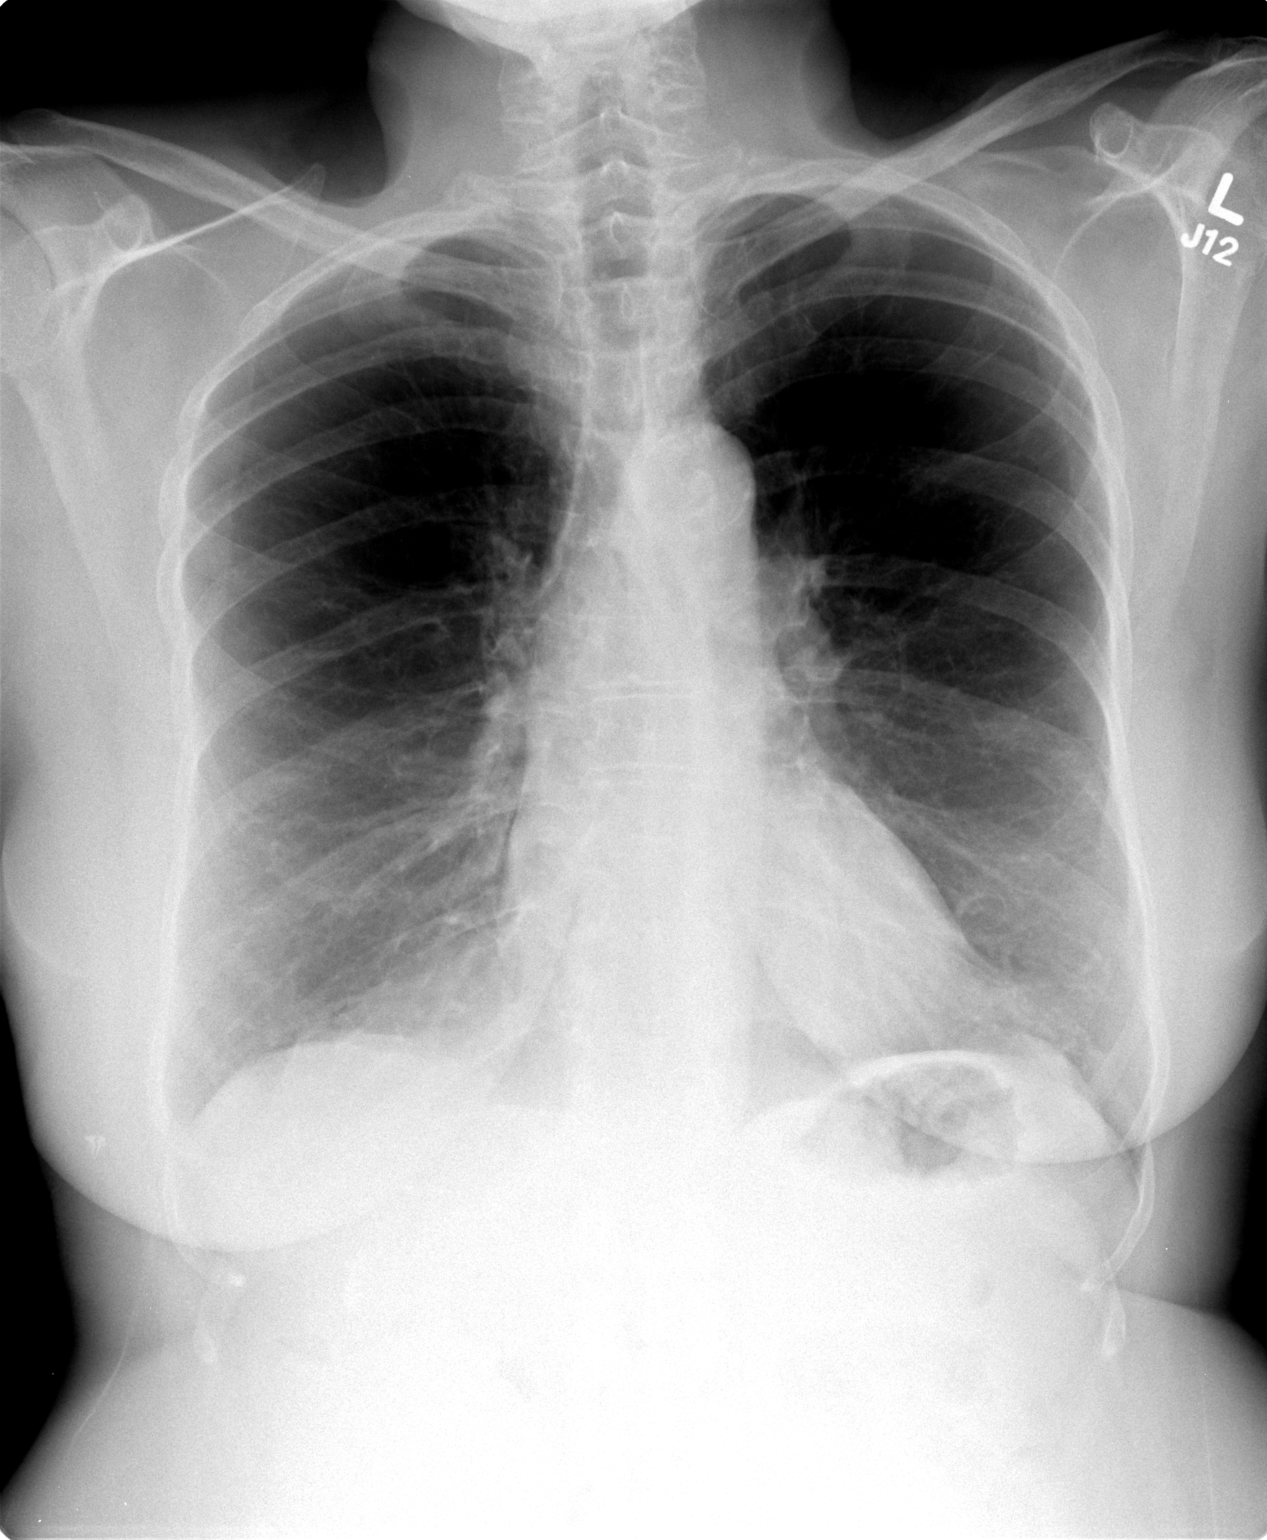

[view not recorded (2 of 2)]
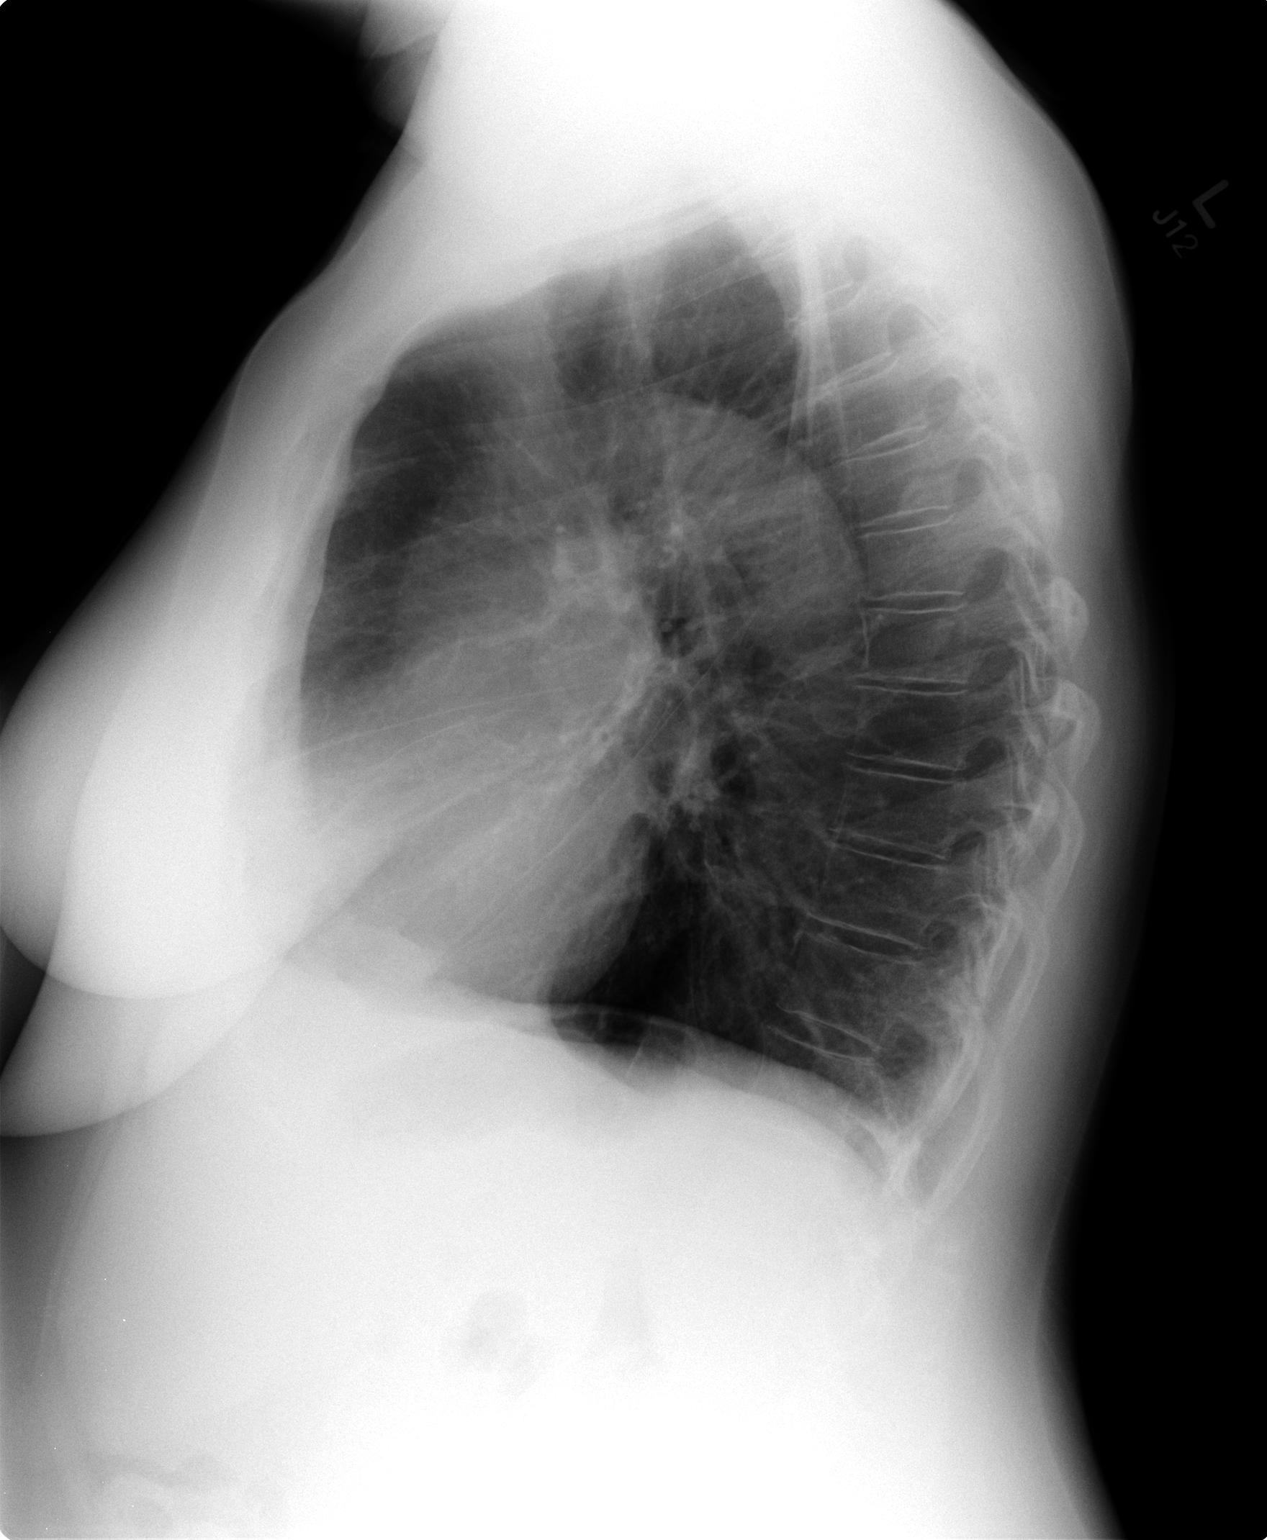

[2 of 2 positions shown; findings below may reference images not displayed]

FINDINGS: The lungs are mildly hyperinflated with hemidiaphragm flattening.
There is no focal infiltrate. The heart and pulmonary vascularity
are normal. The mediastinum is normal in width. There is no pleural
effusion. The bony thorax is unremarkable.
IMPRESSION: COPD.  There is no active cardiopulmonary disease.

## 2018-04-10 DEATH — deceased
